# Patient Record
Sex: Male | Born: 1955 | Race: Asian | Hispanic: No | Marital: Married | Smoking: Never smoker
Health system: Southern US, Community
[De-identification: ages and names within clinical notes are randomized; demographics above are authoritative.]

## PROBLEM LIST (undated history)

## (undated) DIAGNOSIS — J45909 Unspecified asthma, uncomplicated: Secondary | ICD-10-CM

## (undated) DIAGNOSIS — I639 Cerebral infarction, unspecified: Secondary | ICD-10-CM

## (undated) DIAGNOSIS — I2510 Atherosclerotic heart disease of native coronary artery without angina pectoris: Secondary | ICD-10-CM

## (undated) DIAGNOSIS — N200 Calculus of kidney: Secondary | ICD-10-CM

## (undated) DIAGNOSIS — E119 Type 2 diabetes mellitus without complications: Secondary | ICD-10-CM

## (undated) HISTORY — DX: Unspecified asthma, uncomplicated: J45.909

## (undated) HISTORY — PX: OTHER SURGICAL HISTORY: SHX169

## (undated) HISTORY — DX: Calculus of kidney: N20.0

## (undated) HISTORY — PX: PROSTATE SURGERY: SHX751

---

## 2016-10-05 ENCOUNTER — Ambulatory Visit (HOSPITAL_COMMUNITY)
Admission: EM | Admit: 2016-10-05 | Discharge: 2016-10-05 | Disposition: A | Discharge status: Home / Self Care | Payer: Self-pay | Attending: Family Medicine | Admitting: Family Medicine

## 2016-10-05 ENCOUNTER — Encounter (HOSPITAL_COMMUNITY): Payer: Self-pay | Admitting: *Deleted

## 2016-10-05 DIAGNOSIS — J40 Bronchitis, not specified as acute or chronic: Secondary | ICD-10-CM

## 2016-10-05 DIAGNOSIS — R05 Cough: Secondary | ICD-10-CM

## 2016-10-05 DIAGNOSIS — R062 Wheezing: Secondary | ICD-10-CM

## 2016-10-05 HISTORY — DX: Type 2 diabetes mellitus without complications: E11.9

## 2016-10-05 HISTORY — DX: Atherosclerotic heart disease of native coronary artery without angina pectoris: I25.10

## 2016-10-05 HISTORY — DX: Cerebral infarction, unspecified: I63.9

## 2016-10-05 MED ORDER — ALBUTEROL SULFATE (2.5 MG/3ML) 0.083% IN NEBU
2.5000 mg | INHALATION_SOLUTION | Freq: Once | RESPIRATORY_TRACT | Status: AC
  Administered 2016-10-05: 2.5 mg via RESPIRATORY_TRACT

## 2016-10-05 MED ORDER — ALBUTEROL SULFATE (2.5 MG/3ML) 0.083% IN NEBU
INHALATION_SOLUTION | RESPIRATORY_TRACT | Status: AC
  Filled 2016-10-05: qty 3

## 2016-10-05 MED ORDER — HYDROCODONE-HOMATROPINE 5-1.5 MG/5ML PO SYRP: 5.0000 mL | ORAL_SOLUTION | Freq: Four times a day (QID) | ORAL | 0 refills | Status: DC | PRN

## 2016-10-05 MED ORDER — ALBUTEROL SULFATE HFA 108 (90 BASE) MCG/ACT IN AERS: 2.0000 | INHALATION_SPRAY | RESPIRATORY_TRACT | 1 refills | Status: DC | PRN

## 2016-10-05 MED ORDER — ALBUTEROL SULFATE HFA 108 (90 BASE) MCG/ACT IN AERS
1.0000 | INHALATION_SPRAY | Freq: Once | RESPIRATORY_TRACT | Status: AC
  Administered 2016-10-05: 2 via RESPIRATORY_TRACT

## 2016-10-05 MED ORDER — ALBUTEROL SULFATE HFA 108 (90 BASE) MCG/ACT IN AERS
INHALATION_SPRAY | RESPIRATORY_TRACT | Status: AC
  Filled 2016-10-05: qty 6.7

## 2016-10-05 MED FILL — HYDROCODONE-HOMATROPINE SYR: 5-1.5 | 6 days supply | Qty: 120 | Fill #0

## 2016-10-05 NOTE — ED Triage Notes (Addendum)
Patient reports intermittent productive cough x several days with no relief from OTC, denies fevers and nasal congestion/drainage. Patient just moved to [place] from [place].

## 2016-10-05 NOTE — ED Provider Notes (Signed)
[facility]    CSN: [id number] Arrival date & time: 10/05/16  1341     History   Chief Complaint Chief Complaint  Patient presents with  . Cough    HPI Jordan Bryant is a 61 y.o. male.   Patient reports intermittent productive cough x several days with no relief from OTC, denies fevers and nasal congestion/drainage.   He did have an upper respiratory infection several weeks ago but he seemed to get over this. He's never had asthma. He does have mild diabetes. His daughter is a Librarian, academic in [place]. 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 He does not have a history of asthma.      Past Medical History:  Diagnosis Date  . Coronary artery disease   . Diabetes mellitus without complication (HCC)   . Stroke [facility])     There are no active problems to display for this patient.   Past Surgical History:  Procedure Laterality Date  . PROSTATE SURGERY         Home Medications    Prior to Admission medications   Medication Sig Start Date End Date Taking? Authorizing Provider  aspirin 81 MG chewable tablet Chew by mouth daily.   Yes Historical Provider, MD  glipiZIDE (GLUCOTROL) 10 MG tablet Take 10 mg by mouth daily before breakfast.   Yes Historical Provider, MD  lovastatin (MEVACOR) 10 MG tablet Take 10 mg by mouth at bedtime.   Yes Historical Provider, MD  metFORMIN (GLUCOPHAGE) 500 MG tablet Take by mouth 2 (two) times daily with a meal.   Yes Historical Provider, MD  metoprolol tartrate (LOPRESSOR) 25 MG tablet Take 25 mg by mouth 2 (two) times daily.   Yes Historical Provider, MD  albuterol (PROVENTIL HFA;VENTOLIN HFA) 108 (90 Base) MCG/ACT inhaler Inhale 2 puffs into the lungs every 4 (four) hours as needed for wheezing or shortness of breath (cough, shortness of breath or wheezing.). 10/05/16   Elvina Sidle, MD  HYDROcodone-homatropine (HYDROMET) 5-1.5 MG/5ML syrup Take 5 mLs by mouth every 6 (six) hours as  needed for cough. 10/05/16   Elvina Sidle, MD    Family History History reviewed. No pertinent family history.  Social History Social History  Substance Use Topics  . Smoking status: Never Smoker  . Smokeless tobacco: Never Used  . Alcohol use Not on file     Allergies   Patient has no known allergies.   Review of Systems Review of Systems  Constitutional: Negative.   HENT: Negative.   Respiratory: Positive for cough, chest tightness and wheezing.   Genitourinary: Negative.   Neurological: Negative.   All other systems reviewed and are negative.    Physical Exam Triage Vital Signs ED Triage Vitals  Enc Vitals Group     BP      Pulse      Resp      Temp      Temp src      SpO2      Weight      Height      Head Circumference      Peak Flow      Pain Score      Pain Loc      Pain Edu?      Excl. in GC?    No data found.   Updated Vital Signs BP 120/79 (BP Location: Left Arm)   Pulse 89   Temp 99.2 F (37.3 C) (Oral)   Resp  18   SpO2 94%    Physical Exam  Constitutional: He is oriented to person, place, and time. He appears well-developed and well-nourished.  HENT:  Right Ear: External ear normal.  Left Ear: External ear normal.  Mouth/Throat: Oropharynx is clear and moist.  Eyes: Conjunctivae and EOM are normal. Pupils are equal, round, and reactive to light.  Neck: Normal range of motion. Neck supple.  Cardiovascular: Regular rhythm and normal heart sounds.   Pulmonary/Chest: Effort normal. He has wheezes.  Musculoskeletal: Normal range of motion.  Neurological: He is alert and oriented to person, place, and time.  Skin: Skin is warm and dry.  Nursing note and vitals reviewed.    UC Treatments / Results  Labs (all labs ordered are listed, but only abnormal results are displayed) Labs Reviewed - No data to display  EKG  EKG Interpretation None       Radiology No results found.  Procedures Procedures (including critical care  time)  Medications Ordered in UC Medications  albuterol (PROVENTIL) (2.5 MG/3ML) 0.083% nebulizer solution 2.5 mg (not administered)     Initial Impression / Assessment and Plan / UC Course  I have reviewed the triage vital signs and the nursing notes.  Pertinent labs & imaging results that were available during my care of the patient were reviewed by me and considered in my medical decision making (see chart for details).     Final Clinical Impressions(s) / UC Diagnoses   Final diagnoses:  Bronchitis    New Prescriptions New Prescriptions   ALBUTEROL (PROVENTIL HFA;VENTOLIN HFA) 108 (90 BASE) MCG/ACT INHALER    Inhale 2 puffs into the lungs every 4 (four) hours as needed for wheezing or shortness of breath (cough, shortness of breath or wheezing.).   HYDROCODONE-HOMATROPINE (HYDROMET) 5-1.5 MG/5ML SYRUP    Take 5 mLs by mouth every 6 (six) hours as needed for cough.     Elvina Sidle, MD 10/05/16 [phone]

## 2016-10-11 ENCOUNTER — Ambulatory Visit (INDEPENDENT_AMBULATORY_CARE_PROVIDER_SITE_OTHER): Payer: Self-pay | Admitting: Physician Assistant

## 2016-10-11 ENCOUNTER — Encounter (INDEPENDENT_AMBULATORY_CARE_PROVIDER_SITE_OTHER): Payer: Self-pay | Admitting: Physician Assistant

## 2016-10-11 ENCOUNTER — Ambulatory Visit (HOSPITAL_COMMUNITY)
Admission: RE | Admit: 2016-10-11 | Discharge: 2016-10-11 | Disposition: A | Discharge status: Home / Self Care | Payer: Self-pay | Source: Ambulatory Visit | Attending: Physician Assistant | Admitting: Physician Assistant

## 2016-10-11 VITALS — BP 110/69 | HR 76 | Temp 98.0°F | Resp 18 | Ht 68.0 in | Wt 161.0 lb

## 2016-10-11 DIAGNOSIS — E119 Type 2 diabetes mellitus without complications: Secondary | ICD-10-CM

## 2016-10-11 DIAGNOSIS — J9811 Atelectasis: Secondary | ICD-10-CM | POA: Insufficient documentation

## 2016-10-11 DIAGNOSIS — R05 Cough: Secondary | ICD-10-CM | POA: Insufficient documentation

## 2016-10-11 DIAGNOSIS — M25512 Pain in left shoulder: Secondary | ICD-10-CM | POA: Insufficient documentation

## 2016-10-11 DIAGNOSIS — R059 Cough, unspecified: Secondary | ICD-10-CM

## 2016-10-11 LAB — GLUCOSE, POCT (MANUAL RESULT ENTRY): POC Glucose: 194 mg/dl — AB (ref 70–99)

## 2016-10-11 LAB — POCT GLYCOSYLATED HEMOGLOBIN (HGB A1C): HEMOGLOBIN A1C: 7.8

## 2016-10-11 MED ORDER — AZITHROMYCIN 250 MG PO TABS: ORAL_TABLET | ORAL | 0 refills | Status: DC

## 2016-10-11 MED ORDER — ACETAMINOPHEN 500 MG PO TABS: 1000.0000 mg | ORAL_TABLET | Freq: Three times a day (TID) | ORAL | 0 refills | Status: DC | PRN

## 2016-10-11 MED ORDER — ACETAMINOPHEN 500 MG PO TABS: 1000.0000 mg | ORAL_TABLET | Freq: Three times a day (TID) | ORAL | 0 refills | Status: AC | PRN

## 2016-10-11 MED ORDER — NAPROXEN 500 MG PO TABS: 500.0000 mg | ORAL_TABLET | Freq: Two times a day (BID) | ORAL | 0 refills | Status: DC

## 2016-10-11 MED FILL — NAPROXEN 500 MG TABLET: 500 | 15 days supply | Qty: 30 | Fill #0

## 2016-10-11 MED FILL — AZITHROMYCIN 250 MG TABLET: 250 | 5 days supply | Qty: 6 | Fill #0

## 2016-10-11 NOTE — Patient Instructions (Signed)
Diabetes Mellitus and Exercise Exercising regularly is important for your overall health, especially when you have diabetes (diabetes mellitus). Exercising is not only about losing weight. It has many health benefits, such as increasing muscle strength and bone density and reducing body fat and stress. This leads to improved fitness, flexibility, and endurance, all of which result in better overall health. Exercise has additional benefits for people with diabetes, including:  Reducing appetite.  Helping to lower and control blood glucose.  Lowering blood pressure.  Helping to control amounts of fatty substances (lipids) in the blood, such as cholesterol and triglycerides.  Helping the body to respond better to insulin (improving insulin sensitivity).  Reducing how much insulin the body needs.  Decreasing the risk for heart disease by:  Lowering cholesterol and triglyceride levels.  Increasing the levels of good cholesterol.  Lowering blood glucose levels. What is my activity plan? Your health care provider or certified diabetes educator can help you make a plan for the type and frequency of exercise (activity plan) that works for you. Make sure that you:  Do at least 150 minutes of moderate-intensity or vigorous-intensity exercise each week. This could be brisk walking, biking, or water aerobics.  Do stretching and strength exercises, such as yoga or weightlifting, at least 2 times a week.  Spread out your activity over at least 3 days of the week.  Get some form of physical activity every day.  Do not go more than 2 days in a row without some kind of physical activity.  Avoid being inactive for more than 90 minutes at a time. Take frequent breaks to walk or stretch.  Choose a type of exercise or activity that you enjoy, and set realistic goals.  Start slowly, and gradually increase the intensity of your exercise over time. What do I need to know about managing my  diabetes?  Check your blood glucose before and after exercising.  If your blood glucose is higher than 240 mg/dL (13.3 mmol/L) before you exercise, check your urine for ketones. If you have ketones in your urine, do not exercise until your blood glucose returns to normal.  Know the symptoms of low blood glucose (hypoglycemia) and how to treat it. Your risk for hypoglycemia increases during and after exercise. Common symptoms of hypoglycemia can include:  Hunger.  Anxiety.  Sweating and feeling clammy.  Confusion.  Dizziness or feeling light-headed.  Increased heart rate or palpitations.  Blurry vision.  Tingling or numbness around the mouth, lips, or tongue.  Tremors or shakes.  Irritability.  Keep a rapid-acting carbohydrate snack available before, during, and after exercise to help prevent or treat hypoglycemia.  Avoid injecting insulin into areas of the body that are going to be exercised. For example, avoid injecting insulin into:  The arms, when playing tennis.  The legs, when jogging.  Keep records of your exercise habits. Doing this can help you and your health care provider adjust your diabetes management plan as needed. Write down:  Food that you eat before and after you exercise.  Blood glucose levels before and after you exercise.  The type and amount of exercise you have done.  When your insulin is expected to peak, if you use insulin. Avoid exercising at times when your insulin is peaking.  When you start a new exercise or activity, work with your health care provider to make sure the activity is safe for you, and to adjust your insulin, medicines, or food intake as needed.  Drink plenty   of water while you exercise to prevent dehydration or heat stroke. Drink enough fluid to keep your urine clear or pale yellow. This information is not intended to replace advice given to you by your health care provider. Make sure you discuss any questions you have with  your health care provider. Document Released: 08/20/2003 Document Revised: 12/18/2015 Document Reviewed: 11/09/2015 Elsevier Interactive Patient Education  2017 [employer]  

## 2016-10-11 NOTE — Progress Notes (Signed)
Subjective:  Patient ID: Jordan Bryant, male    DOB: 06-02-1956  Age: 61 y.o. MRN: [id number]  CC: cough, left shoulder pain  HPI Jordan Bryant is a 61 y.o. RHD male with a PMH of CAD, DM2, and Stroke presents to f/u on ED visit 10/05/16. Diagnosed with Bronchitis, prescribed Albuterol and Hydromet. Cough has not been eliminated but Hydromet has helped reduce the cough when taken. Reports excessive amount of coughing yesterday, less so today. Does not endorse f/c/n/v, SOB, CP, prolonged travel.     Pt also complains of a 3 wk hx of L shoulder pain. He exercises at the gym but can not attribute pain to anything in particular. Denies trauma or fall. States abduction and flexion to approximately 90 degrees elicits pain in the anterior aspect of shoulder. Denies weakness, paralysis, tingling, or numbness.     Outpatient Medications Prior to Visit  Medication Sig Dispense Refill  . albuterol (PROVENTIL HFA;VENTOLIN HFA) 108 (90 Base) MCG/ACT inhaler Inhale 2 puffs into the lungs every 4 (four) hours as needed for wheezing or shortness of breath (cough, shortness of breath or wheezing.). 1 Inhaler 1  . aspirin 81 MG chewable tablet Chew by mouth daily.    Marland Kitchen glipiZIDE (GLUCOTROL) 10 MG tablet Take 10 mg by mouth daily before breakfast.    . HYDROcodone-homatropine (HYDROMET) 5-1.5 MG/5ML syrup Take 5 mLs by mouth every 6 (six) hours as needed for cough. 120 mL 0  . lovastatin (MEVACOR) 10 MG tablet Take 10 mg by mouth at bedtime.    . metFORMIN (GLUCOPHAGE) 500 MG tablet Take by mouth 2 (two) times daily with a meal.    . metoprolol tartrate (LOPRESSOR) 25 MG tablet Take 25 mg by mouth 2 (two) times daily.     No facility-administered medications prior to visit.      ROS Review of Systems  Constitutional: Negative for chills, fever and malaise/fatigue.  Eyes: Negative for blurred vision.  Respiratory: Positive for cough. Negative for shortness of breath.   Cardiovascular: Negative for chest pain  and palpitations.  Gastrointestinal: Negative for abdominal pain and nausea.  Genitourinary: Negative for dysuria and hematuria.  Musculoskeletal: Positive for joint pain. Negative for myalgias.  Skin: Negative for rash.  Neurological: Negative for tingling and headaches.  Psychiatric/Behavioral: Negative for depression. The patient is not nervous/anxious.     Objective:  BP 110/69 (BP Location: Left Arm, Patient Position: Sitting, Cuff Size: Normal)   Pulse 76   Temp 98 F (36.7 C) (Oral)   Resp 18   Ht  (1.727 m)   Wt 161 lb (73 kg)   SpO2 94%   BMI 24.48 kg/m   BP/Weight 10/11/2016 10/05/2016  Systolic BP 110 120  Diastolic BP 69 79  Wt. (Lbs) 161 -  BMI 24.48 -      Physical Exam  Constitutional: He is oriented to person, place, and time.  Well developed, mild central obesity, NAD, polite  HENT:  Head: Normocephalic and atraumatic.  Eyes: No scleral icterus.  Neck: Normal range of motion. Neck supple. No thyromegaly present.  Cardiovascular: Normal rate, regular rhythm and normal heart sounds.   Pulmonary/Chest: Effort normal and breath sounds normal.  Musculoskeletal: He exhibits no edema.  Left shoulder with positive Apprehension test, External rotation resistance testing, cross body adduction, and painful arc. Negative O'Briens', AC shear testing, internal rotation resistance, and Napoleon's.   Neurological: He is alert and oriented to person, place, and time.  Skin: Skin is warm and dry.  No rash noted. No erythema. No pallor.  Psychiatric: He has a normal mood and affect. His behavior is normal. Thought content normal.  Vitals reviewed.    Assessment & Plan:   1. Cough - No CXR at ED. Cough persists, will start empirically on Zpack. - DG Chest 2 View; Future - Begin azithromycin (ZITHROMAX) 250 MG tablet; Two tablets together on day one. Then one tablet daily thereafter.  Dispense: 6 tablet; Refill: 0  2. Type 2 diabetes mellitus without complication,  without long-term current use of insulin (HCC) - HgB A1c 7.8% in clinic today. - Glucose (CBG) 194 - Pt declines increase in Metformin. Would like to diet and exercise first.   3. Acute pain of left shoulder - Suspected subscapularis injury - DG Shoulder Left; Future - Advised to stop left shoulder exercises. - Consider PT should pain continue. - Begin naproxen (NAPROSYN) 500 MG tablet; Take 1 tablet (500 mg total) by mouth 2 (two) times daily with a meal.  Dispense: 30 tablet; Refill: 0 - Begin acetaminophen (TYLENOL) 500 MG tablet; Take 2 tablets (1,000 mg total) by mouth every 8 (eight) hours as needed.  Dispense: 21 tablet; Refill: 0 - Declined Tylenol #3  Meds ordered this encounter  Medications  . naproxen (NAPROSYN) 500 MG tablet    Sig: Take 1 tablet (500 mg total) by mouth 2 (two) times daily with a meal.    Dispense:  30 tablet    Refill:  0    Order Specific Question:   Supervising Provider    Answer:   Quentin Angst [id number]  . acetaminophen (TYLENOL) 500 MG tablet    Sig: Take 2 tablets (1,000 mg total) by mouth every 8 (eight) hours as needed.    Dispense:  21 tablet    Refill:  0    Order Specific Question:   Supervising Provider    Answer:   Quentin Angst [id number]  . azithromycin (ZITHROMAX) 250 MG tablet    Sig: Two tablets together on day one. Then one tablet daily thereafter.    Dispense:  6 tablet    Refill:  0    Order Specific Question:   Supervising Provider    Answer:   Quentin Angst [id number]    Follow-up: Return in about 2 weeks (around 10/25/2016) for shoulder pain. Diabetes full exam..   Loletta Specter PA

## 2016-10-26 ENCOUNTER — Ambulatory Visit (INDEPENDENT_AMBULATORY_CARE_PROVIDER_SITE_OTHER): Payer: Self-pay | Admitting: Physician Assistant

## 2016-11-01 ENCOUNTER — Encounter (INDEPENDENT_AMBULATORY_CARE_PROVIDER_SITE_OTHER): Payer: Self-pay | Admitting: Physician Assistant

## 2016-11-01 ENCOUNTER — Other Ambulatory Visit: Payer: Self-pay | Admitting: Pharmacist

## 2016-11-01 ENCOUNTER — Ambulatory Visit (INDEPENDENT_AMBULATORY_CARE_PROVIDER_SITE_OTHER): Payer: Self-pay | Admitting: Physician Assistant

## 2016-11-01 VITALS — BP 122/79 | HR 69 | Temp 97.8°F | Wt 170.3 lb

## 2016-11-01 DIAGNOSIS — E785 Hyperlipidemia, unspecified: Secondary | ICD-10-CM

## 2016-11-01 DIAGNOSIS — E119 Type 2 diabetes mellitus without complications: Secondary | ICD-10-CM

## 2016-11-01 DIAGNOSIS — Z794 Long term (current) use of insulin: Secondary | ICD-10-CM

## 2016-11-01 DIAGNOSIS — S46002A Unspecified injury of muscle(s) and tendon(s) of the rotator cuff of left shoulder, initial encounter: Secondary | ICD-10-CM

## 2016-11-01 MED ORDER — LANCETS MISC: 1.0000 | Freq: Three times a day (TID) | 11 refills | Status: DC

## 2016-11-01 MED ORDER — DICLOFENAC SODIUM 75 MG PO TBEC: 75.0000 mg | DELAYED_RELEASE_TABLET | Freq: Two times a day (BID) | ORAL | 0 refills | Status: DC

## 2016-11-01 MED ORDER — TRUE METRIX AIR GLUCOSE METER W/DEVICE KIT: 1.0000 | PACK | 0 refills | Status: DC

## 2016-11-01 MED ORDER — GLUCOSE BLOOD VI STRP: ORAL_STRIP | 12 refills | Status: DC

## 2016-11-01 MED FILL — !TRUE METRIX BLOOD GLUCOSE: 30 days supply | Qty: 1 | Fill #0

## 2016-11-01 MED FILL — TRUEplus LANCETS 28G MISC: 30 days supply | Qty: 100 | Fill #0

## 2016-11-01 MED FILL — TRUE METRIX TEST STRIP: 30 days supply | Qty: 100 | Fill #0

## 2016-11-01 MED FILL — ?DICLOFENAC SOD DR 75 MG TA: 75 | 15 days supply | Qty: 30 | Fill #0

## 2016-11-01 NOTE — Patient Instructions (Addendum)
I believe you may have injury to the subscapularis. I have made an orthopedic referral for you. Please take your medications as directed. I will send out lovastatin according to your cholesterol level.   Rotator Cuff Injury Rotator cuff injury is any type of injury to the set of muscles and tendons that make up the stabilizing unit of your shoulder. This unit holds the ball of your upper arm bone (humerus) in the socket of your shoulder blade (scapula). What are the causes? Injuries to your rotator cuff most commonly come from sports or activities that cause your arm to be moved repeatedly over your head. Examples of this include throwing, weight lifting, swimming, or racquet sports. Long lasting (chronic) irritation of your rotator cuff can cause soreness and swelling (inflammation), bursitis, and eventual damage to your tendons, such as a tear (rupture). What are the signs or symptoms? Acute rotator cuff tear:  Sudden tearing sensation followed by severe pain shooting from your upper shoulder down your arm toward your elbow.  Decreased range of motion of your shoulder because of pain and muscle spasm.  Severe pain.  Inability to raise your arm out to the side because of pain and loss of muscle power (large tears). Chronic rotator cuff tear:  Pain that usually is worse at night and may interfere with sleep.  Gradual weakness and decreased shoulder motion as the pain worsens.  Decreased range of motion. Rotator cuff tendinitis:  Deep ache in your shoulder and the outside upper arm over your shoulder.  Pain that comes on gradually and becomes worse when lifting your arm to the side or turning it inward. How is this diagnosed? Rotator cuff injury is diagnosed through a medical history, physical exam, and imaging exam. The medical history helps determine the type of rotator cuff injury. Your health care provider will look at your injured shoulder, feel the injured area, and ask you to move  your shoulder in different positions. X-ray exams typically are done to rule out other causes of shoulder pain, such as fractures. MRI is the exam of choice for the most severe shoulder injuries because the images show muscles and tendons. How is this treated? Chronic tear:  Medicine for pain, such as acetaminophen or ibuprofen.  Physical therapy and range-of-motion exercises may be helpful in maintaining shoulder function and strength.  Steroid injections into your shoulder joint.  Surgical repair of the rotator cuff if the injury does not heal with noninvasive treatment. Acute tear:  Anti-inflammatory medicines such as ibuprofen and naproxen to help reduce pain and swelling.  A sling to help support your arm and rest your rotator cuff muscles. Long-term use of a sling is not advised. It may cause significant stiffening of the shoulder joint.  Surgery may be considered within a few weeks, especially in younger, active people, to return the shoulder to full function.  Indications for surgical treatment include the following:  Age younger than 60 years.  Rotator cuff tears that are complete.  Physical therapy, rest, and anti-inflammatory medicines have been used for 6-8 weeks, with no improvement.  Employment or sporting activity that requires constant shoulder use. Tendinitis:  Anti-inflammatory medicines such as ibuprofen and naproxen to help reduce pain and swelling.  A sling to help support your arm and rest your rotator cuff muscles. Long-term use of a sling is not advised. It may cause significant stiffening of the shoulder joint.  Severe tendinitis may require:  Steroid injections into your shoulder joint.  Physical therapy.  Surgery. Follow these instructions at home:  Apply ice to your injury:  Put ice in a plastic bag.  Place a towel between your skin and the bag.  Leave the ice on for 20 minutes, 2-3 times a day.  If you have a shoulder immobilizer (sling  and straps), wear it until told otherwise by your health care provider.  You may want to sleep on several pillows or in a recliner at night to lessen swelling and pain.  Only take over-the-counter or prescription medicines for pain, discomfort, or fever as directed by your health care provider.  Do simple hand squeezing exercises with a soft rubber ball to decrease hand swelling. Contact a health care provider if:  Your shoulder pain increases, or new pain or numbness develops in your arm, hand, or fingers.  Your hand or fingers are colder than your other hand. Get help right away if:  Your arm, hand, or fingers are numb or tingling.  Your arm, hand, or fingers are increasingly swollen and painful, or they turn white or blue. This information is not intended to replace advice given to you by your health care provider. Make sure you discuss any questions you have with your health care provider. Document Released: 05/27/2000 Document Revised: 11/05/2015 Document Reviewed: 01/09/2013 Elsevier Interactive Patient Education  2017 [employer].

## 2016-11-01 NOTE — Progress Notes (Signed)
Subjective:  Patient ID: Jordan Bryant, male    DOB: Jun 27, 1955  Age: 61 y.o. MRN: [id number]  CC: shoulder pain and refill requests.  HPI Jordan Bryant is a 61 y.o. male with a PMH of CAD, DM2, and stroke presents with left shoulder pain same as in the last visit. He is now at six weeks of shoulder pain. States abduction and flexion to approximately 90 degrees elicits pain in the anterior aspect of shoulder. Denies weakness, paralysis, tingling, or numbness.  NSAID and Tylenol does not seem to help. Refused narcotic medications for pain relief.     Requests refill of lancets and glucometer strips. Wants these for free and is requesting that his other medications be free of charge when sent to the pharmacy. Says all his medications were given to him free of charge when he lived in [place].      Outpatient Medications Prior to Visit  Medication Sig Dispense Refill  . albuterol (PROVENTIL HFA;VENTOLIN HFA) 108 (90 Base) MCG/ACT inhaler Inhale 2 puffs into the lungs every 4 (four) hours as needed for wheezing or shortness of breath (cough, shortness of breath or wheezing.). 1 Inhaler 1  . aspirin 81 MG chewable tablet Chew by mouth daily.    Marland Kitchen. azithromycin (ZITHROMAX) 250 MG tablet Two tablets together on day one. Then one tablet daily thereafter. 6 tablet 0  . glipiZIDE (GLUCOTROL) 10 MG tablet Take 10 mg by mouth daily before breakfast.    . HYDROcodone-homatropine (HYDROMET) 5-1.5 MG/5ML syrup Take 5 mLs by mouth every 6 (six) hours as needed for cough. 120 mL 0  . lovastatin (MEVACOR) 10 MG tablet Take 10 mg by mouth at bedtime.    . metFORMIN (GLUCOPHAGE) 500 MG tablet Take by mouth 2 (two) times daily with a meal.    . metoprolol tartrate (LOPRESSOR) 25 MG tablet Take 25 mg by mouth 2 (two) times daily.    . naproxen (NAPROSYN) 500 MG tablet Take 1 tablet (500 mg total) by mouth 2 (two) times daily with a meal. 30 tablet 0   No facility-administered medications prior to visit.       ROS Review of Systems  Constitutional: Negative for chills, fever and malaise/fatigue.  Eyes: Negative for blurred vision.  Respiratory: Negative for shortness of breath.   Cardiovascular: Negative for chest pain and palpitations.  Gastrointestinal: Negative for abdominal pain and nausea.  Genitourinary: Negative for dysuria and hematuria.  Musculoskeletal: Negative for joint pain and myalgias.  Skin: Negative for rash.  Neurological: Negative for tingling and headaches.  Psychiatric/Behavioral: Negative for depression. The patient is not nervous/anxious.     Objective:  BP 122/79 (BP Location: Left Arm, Patient Position: Sitting, Cuff Size: Normal)   Pulse 69   Temp 97.8 F (36.6 C) (Oral)   Wt 170 lb 4.2 oz (77.2 kg)   SpO2 96%   BMI 25.89 kg/m   BP/Weight 11/01/2016 10/11/2016 10/05/2016  Systolic BP 122 110 120  Diastolic BP 79 69 79  Wt. (Lbs) 170.26 161 -  BMI 25.89 24.48 -      Physical Exam  Constitutional: He is oriented to person, place, and time.  Well developed, mild central obesity, NAD, polite  HENT:  Head: Normocephalic and atraumatic.  Eyes: No scleral icterus.  Neck: Normal range of motion. Neck supple.  Cardiovascular: Normal rate, regular rhythm and normal heart sounds.   Pulmonary/Chest: Effort normal and breath sounds normal.  Abdominal: Soft. Bowel sounds are normal. There is no tenderness.  Musculoskeletal:  Left shoulder with positive Apprehension test, External rotation resistance, cross body adduction, and painful arc. Negative O'Briens', AC shear testing, internal rotation resistance, and Napoleon's.    Neurological: He is alert and oriented to person, place, and time. No cranial nerve deficit. Coordination normal.  Skin: Skin is warm and dry. No rash noted. No erythema. No pallor.  Psychiatric: He has a normal mood and affect. His behavior is normal. Thought content normal.  Vitals reviewed.    Assessment & Plan:   1. Injury of  left rotator cuff, initial encounter - AMB referral to orthopedics - Begin diclofenac (VOLTAREN) 75 MG EC tablet; Take 1 tablet (75 mg total) by mouth 2 (two) times daily.  Dispense: 30 tablet; Refill: 0 - I advised patient to go to [facility] to work with pharmacy to see if he can be placed on drug manufacturer assistance programs or if the pharmacy itself has financial assistance programs.  2. Hyperlipidemia, unspecified hyperlipidemia type - Lipid panel - Comprehensive metabolic panel - CBC with Differential  3. Type 2 diabetes mellitus without complication, with long-term current use of insulin (HCC) - Begin glucose blood test strip; Use as instructed  Dispense: 100 each; Refill: 12 - Begin Lancets MISC; 1 each by Does not apply route 3 (three) times daily.  Dispense: 90 each; Refill: 11 - Comprehensive metabolic panel - CBC with Differential   Meds ordered this encounter  Medications  . glucose blood test strip    Sig: Use as instructed    Dispense:  100 each    Refill:  12    Order Specific Question:   Supervising Provider    Answer:   Quentin Angst [[id number]]  . Lancets MISC    Sig: 1 each by Does not apply route 3 (three) times daily.    Dispense:  90 each    Refill:  11    Order Specific Question:   Supervising Provider    Answer:   Quentin Angst [id number]  . diclofenac (VOLTAREN) 75 MG EC tablet    Sig: Take 1 tablet (75 mg total) by mouth 2 (two) times daily.    Dispense:  30 tablet    Refill:  0    Order Specific Question:   Supervising Provider    Answer:   Quentin Angst [id number]    Follow-up: Return in about 9 weeks (around 01/03/2017) for shoulder and diabetes.Loletta Specter PA

## 2016-11-02 LAB — CBC WITH DIFFERENTIAL/PLATELET
Basophils Absolute: 0.1 10*3/uL (ref 0.0–0.2)
Basos: 2 %
EOS (ABSOLUTE): 0.4 10*3/uL (ref 0.0–0.4)
Eos: 6 %
Hematocrit: 43.6 % (ref 37.5–51.0)
Hemoglobin: 14.9 g/dL (ref 13.0–17.7)
IMMATURE GRANULOCYTES: 0 %
Immature Grans (Abs): 0 10*3/uL (ref 0.0–0.1)
Lymphocytes Absolute: 1.8 10*3/uL (ref 0.7–3.1)
Lymphs: 27 %
MCH: 27.3 pg (ref 26.6–33.0)
MCHC: 34.2 g/dL (ref 31.5–35.7)
MCV: 80 fL (ref 79–97)
MONOS ABS: 0.6 10*3/uL (ref 0.1–0.9)
Monocytes: 9 %
NEUTROS PCT: 56 %
Neutrophils Absolute: 3.8 10*3/uL (ref 1.4–7.0)
PLATELETS: 188 10*3/uL (ref 150–379)
RBC: 5.45 x10E6/uL (ref 4.14–5.80)
RDW: 14.5 % (ref 12.3–15.4)
WBC: 6.7 10*3/uL (ref 3.4–10.8)

## 2016-11-02 LAB — LIPID PANEL
CHOLESTEROL TOTAL: 141 mg/dL (ref 100–199)
Chol/HDL Ratio: 3.2 ratio (ref 0.0–5.0)
HDL: 44 mg/dL (ref >39)
LDL CALC: 61 mg/dL (ref 0–99)
Triglycerides: 182 mg/dL — ABNORMAL HIGH (ref 0–149)
VLDL CHOLESTEROL CAL: 36 mg/dL (ref 5–40)

## 2016-11-02 LAB — COMPREHENSIVE METABOLIC PANEL
A/G RATIO: 1.5 (ref 1.2–2.2)
ALK PHOS: 60 IU/L (ref 39–117)
ALT: 12 IU/L (ref 0–44)
AST: 18 IU/L (ref 0–40)
Albumin: 4.3 g/dL (ref 3.6–4.8)
BUN/Creatinine Ratio: 17 (ref 10–24)
BUN: 15 mg/dL (ref 8–27)
Bilirubin Total: 0.4 mg/dL (ref 0.0–1.2)
CHLORIDE: 102 mmol/L (ref 96–106)
CO2: 24 mmol/L (ref 18–29)
Calcium: 8.9 mg/dL (ref 8.6–10.2)
Creatinine, Ser: 0.9 mg/dL (ref 0.76–1.27)
GFR calc Af Amer: 105 mL/min/{1.73_m2} (ref >59)
GFR calc non Af Amer: 91 mL/min/{1.73_m2} (ref >59)
GLOBULIN, TOTAL: 2.8 g/dL (ref 1.5–4.5)
Glucose: 245 mg/dL — ABNORMAL HIGH (ref 65–99)
POTASSIUM: 4.7 mmol/L (ref 3.5–5.2)
SODIUM: 137 mmol/L (ref 134–144)
Total Protein: 7.1 g/dL (ref 6.0–8.5)

## 2016-11-03 ENCOUNTER — Telehealth (INDEPENDENT_AMBULATORY_CARE_PROVIDER_SITE_OTHER): Payer: Self-pay | Admitting: Physician Assistant

## 2016-11-03 ENCOUNTER — Other Ambulatory Visit (INDEPENDENT_AMBULATORY_CARE_PROVIDER_SITE_OTHER): Payer: Self-pay | Admitting: Physician Assistant

## 2016-11-03 ENCOUNTER — Ambulatory Visit (INDEPENDENT_AMBULATORY_CARE_PROVIDER_SITE_OTHER): Payer: Self-pay | Admitting: Orthopedic Surgery

## 2016-11-03 DIAGNOSIS — E785 Hyperlipidemia, unspecified: Secondary | ICD-10-CM

## 2016-11-03 MED ORDER — LOVASTATIN 10 MG PO TABS: 10.0000 mg | ORAL_TABLET | Freq: Every day | ORAL | 3 refills | Status: DC

## 2016-11-03 MED FILL — LOVASTATIN 10 MG TABLET: 10 | 30 days supply | Qty: 30 | Fill #0

## 2016-11-03 NOTE — Telephone Encounter (Signed)
Already addressed

## 2016-11-03 NOTE — Telephone Encounter (Signed)
FWD to PCP. Relda Agosto S Ruthmary Occhipinti, CMA  

## 2016-11-03 NOTE — Progress Notes (Signed)
Triglycerides elevated.

## 2016-11-03 NOTE — Telephone Encounter (Signed)
Patient here would like to lab result and wants to know which RX should he start for cholesterol.  Per patient does not have anymore medicine for cholesterol.  Please follow up with patient

## 2016-11-16 ENCOUNTER — Ambulatory Visit: Payer: Self-pay | Attending: Physician Assistant

## 2016-11-17 ENCOUNTER — Telehealth (INDEPENDENT_AMBULATORY_CARE_PROVIDER_SITE_OTHER): Payer: Self-pay | Admitting: Physician Assistant

## 2016-11-17 NOTE — Telephone Encounter (Signed)
FWD to PCP. Tempestt S Roberts, CMA  

## 2016-11-17 NOTE — Telephone Encounter (Signed)
Patient called stated that needs RX metformin 1000 mg tab. Stated he takes 1000 mg in morning and 1000mg  in afternoon.   Please follow up with patient.  [facility].

## 2016-11-17 NOTE — Telephone Encounter (Signed)
Pt's record show he was on metformin 500mg  BID so I refilled his medication at his first visit. Furthermore, he refused a higher dose of Metformin when I offered it to him, he stated he would rather diet and exercise. Please tell him to schedule an appointment and bring in his glucose log.

## 2016-11-18 NOTE — Telephone Encounter (Signed)
Patient called back at 3: 01 pm left voicemail to call him back, checking on status of Rx he request for Metformin.  Please follow up with patient.

## 2016-11-21 ENCOUNTER — Other Ambulatory Visit (INDEPENDENT_AMBULATORY_CARE_PROVIDER_SITE_OTHER): Payer: Self-pay | Admitting: Physician Assistant

## 2016-11-21 DIAGNOSIS — E119 Type 2 diabetes mellitus without complications: Secondary | ICD-10-CM

## 2016-11-21 MED ORDER — METFORMIN HCL 500 MG PO TABS: 500.0000 mg | ORAL_TABLET | Freq: Two times a day (BID) | ORAL | 3 refills | Status: DC

## 2016-11-21 MED FILL — ?METFORMIN HCL 500MG TABLET: 500 | 30 days supply | Qty: 60 | Fill #0

## 2016-11-21 NOTE — Telephone Encounter (Signed)
Patient called this morning left voicemail

## 2016-11-21 NOTE — Telephone Encounter (Signed)
Patient is requesting a refill on metformin which is a historical medication.

## 2016-11-21 NOTE — Telephone Encounter (Signed)
I have spoke with patient and scheduled an appointment for tomorrow, Patient is aware that he will need to bring his glucose log or his glucometer. Maryjean Mornempestt S Vuk Skillern, CMA

## 2016-11-21 NOTE — Telephone Encounter (Signed)
Let patient know his metformin has been refilled. He should have a f/u in the future and remember to bring glucose log.

## 2016-11-21 NOTE — Progress Notes (Signed)
Refill request

## 2016-11-21 NOTE — Telephone Encounter (Signed)
Patient called this am left voicemail stated has pills for today only.  Stated needs for someone too call him back with refills today. Stated called Friday also and no one returned his call.  Message for Friday was documented. Please follow up with patient.

## 2016-11-22 ENCOUNTER — Encounter (INDEPENDENT_AMBULATORY_CARE_PROVIDER_SITE_OTHER): Payer: Self-pay | Admitting: Physician Assistant

## 2016-11-22 ENCOUNTER — Ambulatory Visit (INDEPENDENT_AMBULATORY_CARE_PROVIDER_SITE_OTHER): Payer: Self-pay | Admitting: Physician Assistant

## 2016-11-22 VITALS — BP 122/82 | HR 89 | Temp 98.9°F | Wt 171.0 lb

## 2016-11-22 DIAGNOSIS — E119 Type 2 diabetes mellitus without complications: Secondary | ICD-10-CM

## 2016-11-22 DIAGNOSIS — Z794 Long term (current) use of insulin: Secondary | ICD-10-CM

## 2016-11-22 DIAGNOSIS — Z760 Encounter for issue of repeat prescription: Secondary | ICD-10-CM

## 2016-11-22 LAB — GLUCOSE, POCT (MANUAL RESULT ENTRY): POC GLUCOSE: 170 mg/dL — AB (ref 70–99)

## 2016-11-22 MED ORDER — GLIPIZIDE 5 MG PO TABS: 5.0000 mg | ORAL_TABLET | Freq: Two times a day (BID) | ORAL | 3 refills | Status: DC

## 2016-11-22 MED ORDER — METOPROLOL TARTRATE 25 MG PO TABS: 12.5000 mg | ORAL_TABLET | Freq: Two times a day (BID) | ORAL | 3 refills | Status: DC

## 2016-11-22 MED ORDER — METFORMIN HCL 1000 MG PO TABS: 1000.0000 mg | ORAL_TABLET | Freq: Two times a day (BID) | ORAL | 3 refills | Status: DC

## 2016-11-22 MED FILL — ?METOPROLOL 25 MG TABLET: 25 | 30 days supply | Qty: 30 | Fill #0

## 2016-11-22 MED FILL — ?GLIPIZIDE 5MG TABLET: 5 | 30 days supply | Qty: 60 | Fill #0

## 2016-11-22 NOTE — Progress Notes (Signed)
Subjective:  Patient ID: Jordan Bryant, male    DOB: 03/18/1956  Age: 61 y.o. MRN: [id number]  CC: requests increase in metformin  HPI Jordan Bryant is a 61 y.o. male with a PMH of DM2 presents to request medication refills. Would like metformin 1037m BID, glipizide 565mTID, and Metoprolol 2574malf tab BID. He is trying to control his blood sugar through diet and exercise but has been noticing slightly higher glucose readings since he is running out of metformin. Blood sugars sometimes reach 230s. No other symptoms or complaints.   *English is moderately good but there are times pt does not respond appropriately to questions or fully comprehend what is being said/asked. Rejects the need for interpreter.    Outpatient Medications Prior to Visit  Medication Sig Dispense Refill  . albuterol (PROVENTIL HFA;VENTOLIN HFA) 108 (90 Base) MCG/ACT inhaler Inhale 2 puffs into the lungs every 4 (four) hours as needed for wheezing or shortness of breath (cough, shortness of breath or wheezing.). 1 Inhaler 1  . aspirin 81 MG chewable tablet Chew by mouth daily.    . aMarland Kitchenithromycin (ZITHROMAX) 250 MG tablet Two tablets together on day one. Then one tablet daily thereafter. 6 tablet 0  . Blood Glucose Monitoring Suppl (TRUE METRIX AIR GLUCOSE METER) w/Device KIT 1 each by Does not apply route as directed. 1 kit 0  . diclofenac (VOLTAREN) 75 MG EC tablet Take 1 tablet (75 mg total) by mouth 2 (two) times daily. 30 tablet 0  . glucose blood test strip Use as instructed 100 each 12  . HYDROcodone-homatropine (HYDROMET) 5-1.5 MG/5ML syrup Take 5 mLs by mouth every 6 (six) hours as needed for cough. 120 mL 0  . Lancets MISC 1 each by Does not apply route 3 (three) times daily. 90 each 11  . lovastatin (MEVACOR) 10 MG tablet Take 1 tablet (10 mg total) by mouth at bedtime. 90 tablet 3  . naproxen (NAPROSYN) 500 MG tablet Take 1 tablet (500 mg total) by mouth 2 (two) times daily with a meal. 30 tablet 0  . glipiZIDE  (GLUCOTROL) 10 MG tablet Take 10 mg by mouth daily before breakfast.    . metFORMIN (GLUCOPHAGE) 500 MG tablet Take 1 tablet (500 mg total) by mouth 2 (two) times daily with a meal. 60 tablet 3  . metoprolol tartrate (LOPRESSOR) 25 MG tablet Take 25 mg by mouth 2 (two) times daily.     No facility-administered medications prior to visit.      ROS Review of Systems  Constitutional: Negative for chills, fever and malaise/fatigue.  Eyes: Negative for blurred vision.  Respiratory: Negative for shortness of breath.   Cardiovascular: Negative for chest pain and palpitations.  Gastrointestinal: Negative for abdominal pain and nausea.  Genitourinary: Negative for dysuria and hematuria.  Musculoskeletal: Negative for joint pain and myalgias.  Skin: Negative for rash.  Neurological: Negative for tingling and headaches.  Psychiatric/Behavioral: Negative for depression. The patient is not nervous/anxious.     Objective:  BP 122/82 (BP Location: Left Arm, Patient Position: Sitting, Cuff Size: Normal)   Pulse 89   Temp 98.9 F (37.2 C) (Oral)   Wt 171 lb (77.6 kg)   SpO2 95%   BMI 26.00 kg/m   BP/Weight 11/22/2016 10/31/00/585217/7/8242ystolic BP [id number] BP 82 79 69  Wt. (Lbs) 171 170.26 161  BMI 26 25.89 24.48      Physical Exam  Constitutional: He is oriented to person, place, and time.  Well developed, mild central obesity, NAD, polite, does not fully comprehend what is being spoken.  HENT:  Head: Normocephalic and atraumatic.  Eyes: No scleral icterus.  Neck: No thyromegaly present.  Cardiovascular: Normal rate, regular rhythm and normal heart sounds.   Pulmonary/Chest: Effort normal and breath sounds normal.  Neurological: He is alert and oriented to person, place, and time.  Psychiatric: He has a normal mood and affect. His behavior is normal. Thought content normal.  Vitals reviewed.    Assessment & Plan:   1. Type 2 diabetes mellitus without  complication, with long-term current use of insulin (HCC) - Glucose (CBG) 170 in clinic today - Begin metFORMIN (GLUCOPHAGE) 1000 MG tablet; Take 1 tablet (1,000 mg total) by mouth 2 (two) times daily with a meal.  Dispense: 180 tablet; Refill: 3 - Begin Glipizide 1m BID  - Advised patient to call with glucometer readings.  - Will consider GLP1 or SGLT2 if not well controlled. Pt seemed to take interest in these treatment modalities.  2. Medication refill - Refill metoprolol tartrate (LOPRESSOR) 25 MG tablet; Take 0.5 tablets (12.5 mg total) by mouth 2 (two) times daily.  Dispense: 180 tablet; Refill: 3   Meds ordered this encounter  Medications  . metFORMIN (GLUCOPHAGE) 1000 MG tablet    Sig: Take 1 tablet (1,000 mg total) by mouth 2 (two) times daily with a meal.    Dispense:  180 tablet    Refill:  3    Order Specific Question:   Supervising Provider    Answer:   JTresa Garter[[id number] . glipiZIDE (GLUCOTROL) 5 MG tablet    Sig: Take 1 tablet (5 mg total) by mouth 2 (two) times daily before a meal.    Dispense:  180 tablet    Refill:  3    Order Specific Question:   Supervising Provider    Answer:   JTresa Garter[[id number] . metoprolol tartrate (LOPRESSOR) 25 MG tablet    Sig: Take 0.5 tablets (12.5 mg total) by mouth 2 (two) times daily.    Dispense:  180 tablet    Refill:  3    Order Specific Question:   Supervising Provider    Answer:   JTresa Garter[[id number]   Follow-up: Return in about 3 months (around 02/22/2017).   RClent DemarkPA   ]

## 2016-11-22 NOTE — Patient Instructions (Signed)
Diabetes Mellitus and Food It is important for you to manage your blood sugar (glucose) level. Your blood glucose level can be greatly affected by what you eat. Eating healthier foods in the appropriate amounts throughout the day at about the same time each day will help you control your blood glucose level. It can also help slow or prevent worsening of your diabetes mellitus. Healthy eating may even help you improve the level of your blood pressure and reach or maintain a healthy weight. General recommendations for healthful eating and cooking habits include:  Eating meals and snacks regularly. Avoid going long periods of time without eating to lose weight.  Eating a diet that consists mainly of plant-based foods, such as fruits, vegetables, nuts, legumes, and whole grains.  Using low-heat cooking methods, such as baking, instead of high-heat cooking methods, such as deep frying.  Work with your dietitian to make sure you understand how to use the Nutrition Facts information on food labels. How can food affect me? Carbohydrates Carbohydrates affect your blood glucose level more than any other type of food. Your dietitian will help you determine how many carbohydrates to eat at each meal and teach you how to count carbohydrates. Counting carbohydrates is important to keep your blood glucose at a healthy level, especially if you are using insulin or taking certain medicines for diabetes mellitus. Alcohol Alcohol can cause sudden decreases in blood glucose (hypoglycemia), especially if you use insulin or take certain medicines for diabetes mellitus. Hypoglycemia can be a life-threatening condition. Symptoms of hypoglycemia (sleepiness, dizziness, and disorientation) are similar to symptoms of having too much alcohol. If your health care provider has given you approval to drink alcohol, do so in moderation and use the following guidelines:  Women should not have more than one drink per day, and men  should not have more than two drinks per day. One drink is equal to: ? 12 oz of beer. ? 5 oz of wine. ? 1 oz of hard liquor.  Do not drink on an empty stomach.  Keep yourself hydrated. Have water, diet soda, or unsweetened iced tea.  Regular soda, juice, and other mixers might contain a lot of carbohydrates and should be counted.  What foods are not recommended? As you make food choices, it is important to remember that all foods are not the same. Some foods have fewer nutrients per serving than other foods, even though they might have the same number of calories or carbohydrates. It is difficult to get your body what it needs when you eat foods with fewer nutrients. Examples of foods that you should avoid that are high in calories and carbohydrates but low in nutrients include:  Trans fats (most processed foods list trans fats on the Nutrition Facts label).  Regular soda.  Juice.  Candy.  Sweets, such as cake, pie, doughnuts, and cookies.  Fried foods.  What foods can I eat? Eat nutrient-rich foods, which will nourish your body and keep you healthy. The food you should eat also will depend on several factors, including:  The calories you need.  The medicines you take.  Your weight.  Your blood glucose level.  Your blood pressure level.  Your cholesterol level.  You should eat a variety of foods, including:  Protein. ? Lean cuts of meat. ? Proteins low in saturated fats, such as fish, egg whites, and beans. Avoid processed meats.  Fruits and vegetables. ? Fruits and vegetables that may help control blood glucose levels, such as apples,   mangoes, and yams.  Dairy products. ? Choose fat-free or low-fat dairy products, such as milk, yogurt, and cheese.  Grains, bread, pasta, and rice. ? Choose whole grain products, such as multigrain bread, whole oats, and brown rice. These foods may help control blood pressure.  Fats. ? Foods containing healthful fats, such as  nuts, avocado, olive oil, canola oil, and fish.  Does everyone with diabetes mellitus have the same meal plan? Because every person with diabetes mellitus is different, there is not one meal plan that works for everyone. It is very important that you meet with a dietitian who will help you create a meal plan that is just right for you. This information is not intended to replace advice given to you by your health care provider. Make sure you discuss any questions you have with your health care provider. Document Released: 02/24/2005 Document Revised: 11/05/2015 Document Reviewed: 04/26/2013 Elsevier Interactive Patient Education  2017 [employer]vier Inc.  

## 2016-11-24 MED FILL — ?METFORMIN HCL 1,000 MG TAB: 1000 | 30 days supply | Qty: 60 | Fill #0

## 2016-11-28 ENCOUNTER — Telehealth (INDEPENDENT_AMBULATORY_CARE_PROVIDER_SITE_OTHER): Payer: Self-pay | Admitting: Physician Assistant

## 2016-11-28 NOTE — Telephone Encounter (Signed)
FWD to PCP. Markell Sciascia S Guerin Lashomb, CMA  

## 2016-11-28 NOTE — Telephone Encounter (Signed)
Informed patient that [facility]. Maryjean Mornempestt S Tippi Mccrae, CMA

## 2016-11-28 NOTE — Telephone Encounter (Signed)
Patient left voicemail stated needs refills on Levastatin for cholesterol, only has 3 pills left.  Please follow up with patient.

## 2016-11-28 NOTE — Telephone Encounter (Signed)
Refill already sent out for Lovastatin 10mg  on 11/03/16 and sent to [facility].

## 2016-11-30 MED FILL — LOVASTATIN 10 MG TABLET: 10 | 30 days supply | Qty: 30 | Fill #1

## 2016-12-22 ENCOUNTER — Ambulatory Visit (INDEPENDENT_AMBULATORY_CARE_PROVIDER_SITE_OTHER): Payer: Self-pay | Admitting: Physician Assistant

## 2016-12-22 ENCOUNTER — Encounter (INDEPENDENT_AMBULATORY_CARE_PROVIDER_SITE_OTHER): Payer: Self-pay | Admitting: Physician Assistant

## 2016-12-22 VITALS — BP 119/79 | HR 67 | Temp 97.9°F | Wt 168.6 lb

## 2016-12-22 DIAGNOSIS — E119 Type 2 diabetes mellitus without complications: Secondary | ICD-10-CM

## 2016-12-22 DIAGNOSIS — M25512 Pain in left shoulder: Secondary | ICD-10-CM

## 2016-12-22 DIAGNOSIS — Z794 Long term (current) use of insulin: Secondary | ICD-10-CM

## 2016-12-22 DIAGNOSIS — G8929 Other chronic pain: Secondary | ICD-10-CM

## 2016-12-22 MED ORDER — EXENATIDE ER 2 MG ~~LOC~~ PEN: 1.0000 "application " | PEN_INJECTOR | SUBCUTANEOUS | 11 refills | Status: DC

## 2016-12-22 NOTE — Patient Instructions (Signed)
Exenatide injection suspension extended-release (autoinjector) What is this medicine? EXENATIDE (ex EN a tide) is used to improve blood sugar control in adults with type 2 diabetes. This medicine may be used with other oral diabetes medicines. This medicine may be used for other purposes; ask your health care provider or pharmacist if you have questions. COMMON BRAND NAME(S): Bydureon BCise What should I tell my health care provider before I take this medicine? They need to know if you have any of these conditions: -endocrine tumors (MEN 2) or if someone in your family had these tumors -history of pancreatitis -kidney disease or if you are on dialysis -stomach or intestine problems -thyroid cancer or if someone in your family had thyroid cancer -an unusual or allergic reaction to exenatide, medicines, foods, dyes, or preservatives -pregnant or trying to get pregnant -breast-feeding How should I use this medicine? This medicine is for injection under the skin of your upper leg, stomach area, or upper arm. It is usually given once every week (every 7 days). You will be taught how to prepare and give this medicine. Use exactly as directed. Take your medicine at regular intervals. Do not take it more often than directed. It is important that you put your used autoinjectors in a special sharps container. Do not put them in a trash can. If you do not have a sharps container, call your pharmacist or healthcare provider to get one. A special MedGuide will be given to you by the pharmacist with each prescription and refill. Be sure to read this information carefully each time. Talk to your pediatrician regarding the use of this medicine in children. Special care may be needed. Overdosage: If you think you have taken too much of this medicine contact a poison control center or emergency room at once. NOTE: This medicine is only for you. Do not share this medicine with others. What if I miss a dose? If  you miss a dose, take it as soon as you can, provided your next usual scheduled dose is due at least 3 days later. If you miss a dose and your next usual scheduled dose is due 1 or 2 days later, then do not take the missed dose. Take the next dose at your regular time. Do not take double or extra doses. If you have questions about a missed dose, contact your health care provider for advice. What may interact with this medicine? -acetaminophen -birth control pills -digoxin -insulin and other medicines for diabetes -lisinopril -lovastatin -warfarin Many medications may cause changes in blood sugar, these include: -alcohol containing beverages -antiviral medicines for HIV or AIDS -aspirin and aspirin-like drugs -certain medicines for blood pressure, heart disease, irregular heart beat -certain medicines for cholesterol like fenofibrate, gemfibrozil -chromium -diuretics -male hormones, such as estrogens or progestins, birth control pills -isoniazid -lanreotide -male hormones or anabolic steroids -MAOIs like Carbex, Eldepryl, Marplan, Nardil, and Parnate -medicines for allergies, asthma, cold, or cough -medicines for depression, anxiety, or psychotic disturbances -medicines for weight loss -niacin -nicotine -NSAIDs, medicines for pain and inflammation, like ibuprofen or naproxen -octreotide -pasireotide -pentamidine -phenytoin -probenecid -quinolone antibiotics such as ciprofloxacin, levofloxacin, ofloxacin -some herbal dietary supplements -steroid medicines such as prednisone or cortisone -sulfamethoxazole; trimethoprim -thyroid hormones Some medications can hide the warning symptoms of low blood sugar (hypoglycemia). You may need to monitor your blood sugar more closely if you are taking one of these medications. These include: -beta-blockers, often used for high blood pressure or heart problems (examples include atenolol, metoprolol,    propranolol) -clonidine -guanethidine -reserpine This list may not describe all possible interactions. Give your health care provider a list of all the medicines, herbs, non-prescription drugs, or dietary supplements you use. Also tell them if you smoke, drink alcohol, or use illegal drugs. Some items may interact with your medicine. What should I watch for while using this medicine? Visit your doctor or health care professional for regular checks on your progress. A test called the HbA1C (A1C) will be monitored. This is a simple blood test. It measures your blood sugar control over the last 2 to 3 months. You will receive this test every 3 to 6 months. Learn how to check your blood sugar. Learn the symptoms of low and high blood sugar and how to manage them. Always carry a quick-source of sugar with you in case you have symptoms of low blood sugar. Examples include hard sugar candy or glucose tablets. Make sure others know that you can choke if you eat or drink when you develop serious symptoms of low blood sugar, such as seizures or unconsciousness. They must get medical help at once. Tell your doctor or health care professional if you have high blood sugar. You might need to change the dose of your medicine. If you are sick or exercising more than usual, you might need to change the dose of your medicine. Do not skip meals. Ask your doctor or health care professional if you should avoid alcohol. Many nonprescription cough and cold products contain sugar or alcohol. These can affect blood sugar. Autoinjectors should never be shared. Sharing may result in passing of viruses like hepatitis or HIV. Wear a medical ID bracelet or chain, and carry a card that describes your disease and details of your medicine and dosage times. What side effects may I notice from receiving this medicine? Side effects that you should report to your doctor or health care professional as soon as possible: -allergic  reactions like skin rash, itching or hives, swelling of the face, lips, or tongue -breathing problems -diarrhea that continues or is severe -lump or swelling on the neck -severe nausea -signs and symptoms of low blood sugar such as feeling anxious, confusion, dizziness, increased hunger, unusually weak or tired, sweating, shakiness, cold, irritable, headache, blurred vision, fast heartbeat, loss of consciousness -signs and symptoms of kidney injury like trouble passing urine or change in the amount of urine -trouble swallowing -unusual stomach upset or pain -vomiting Side effects that usually do not require medical attention (report these to your doctor or health care professional if they continue or are bothersome): -constipation -diarrhea -dizziness -headache -nausea -pain, redness, or irritation at site where injected -stomach upset This list may not describe all possible side effects. Call your doctor for medical advice about side effects. You may report side effects to FDA at 1-800-FDA-1088. Where should I keep my medicine? Keep out of the reach of children. Store this medicine flat in a refrigerator between 2 and 8 degrees C (36 and 46 degrees F). Do not freeze. Do not use if the medicine has been frozen. Protect from light and excessive heat. Remove from the refrigerator 15 minutes before use. The autoinjector can be kept at room temperature not to exceed 30 degrees C (86 degrees F) for no more than a total of 4 weeks, if needed. Throw away any unused medicine after the expiration date on the label. NOTE: This sheet is a summary. It may not cover all possible information. If you have questions about this medicine,   talk to your doctor, pharmacist, or health care provider.  2018 Elsevier/Gold Standard (2016-06-17 13:46:10)  

## 2016-12-22 NOTE — Progress Notes (Signed)
  Subjective:  Patient ID: Jordan Bryant, male    DOB: 1955-08-02  Age: 61 y.o. MRN: [id number]  CC: left shoulder  HPI Jordan NewerSaghir Rorke is a 61 y.o. male with a PMH of CAD, DM2, and stroke presents with continued pain and limited aROM of the left shoulder. Initially went to "sports therapist" and received massages with no amelioration of shoulder pain. He was referred to orthopedics previously but he could not afford visit and did not go. He has since obtained [employer] [facility] and would like to be referred to physical therapy.     He also states that his blood sugar readings are now in the 200s since he was removed from Glipizide 5 mg TID. He is now taking Glipizide 5mg  BID and Metformin 1000 mg BID. Does not endorse any other symptoms or complaints.     ROS Review of Systems  Constitutional: Negative for chills, fever and malaise/fatigue.  Eyes: Negative for blurred vision.  Respiratory: Negative for shortness of breath.   Cardiovascular: Negative for chest pain and palpitations.  Gastrointestinal: Negative for abdominal pain and nausea.  Genitourinary: Negative for dysuria and hematuria.  Musculoskeletal: Positive for joint pain. Negative for myalgias.  Skin: Negative for rash.  Neurological: Negative for tingling and headaches.  Psychiatric/Behavioral: Negative for depression. The patient is not nervous/anxious.     Objective:  BP 119/79 (BP Location: Left Arm, Patient Position: Sitting, Cuff Size: Normal)   Pulse 67   Temp 97.9 F (36.6 C) (Oral)   Wt 168 lb 9.6 oz (76.5 kg)   SpO2 96%   BMI 25.64 kg/m   BP/Weight 12/22/2016 11/22/2016 11/01/2016  Systolic BP 119 122 122  Diastolic BP 79 82 79  Wt. (Lbs) 168.6 171 170.26  BMI 25.64 26 25.89      Physical Exam  Constitutional: He is oriented to person, place, and time.  Well developed, overweight, NAD  HENT:  Head: Normocephalic and atraumatic.  Eyes: No scleral icterus.  Neck: Normal range of motion.  Neck supple.  Musculoskeletal:  Left shoulder with limited aROM and pROM at approximate 110 degrees of abduction  Neurological: He is alert and oriented to person, place, and time. No cranial nerve deficit. Coordination normal.  LUE strength 5/5  Psychiatric: He has a normal mood and affect. His behavior is normal. Thought content normal.  Vitals reviewed.    Assessment & Plan:   1. Type 2 diabetes mellitus without complication, with long-term current use of insulin (HCC) - Exenatide ER 2 MG PEN; Inject 1 application into the skin once a week.  Dispense: 4 each; Refill: 11  2. Chronic left shoulder pain - AMB referral to orthopedics - Suspected impingement vs rotator cuff injury   Meds ordered this encounter  Medications  . Exenatide ER 2 MG PEN    Sig: Inject 1 application into the skin once a week.    Dispense:  4 each    Refill:  11    Please dispense GLP1 that is discounted or free.    Order Specific Question:   Supervising Provider    Answer:   Quentin AngstJEGEDE, OLUGBEMIGA E [[id number]]    Follow-up: No Follow-up on file.   Loletta Specteroger David Rameen Quinney PA

## 2016-12-23 ENCOUNTER — Other Ambulatory Visit (INDEPENDENT_AMBULATORY_CARE_PROVIDER_SITE_OTHER): Payer: Self-pay | Admitting: Physician Assistant

## 2016-12-23 ENCOUNTER — Telehealth (INDEPENDENT_AMBULATORY_CARE_PROVIDER_SITE_OTHER): Payer: Self-pay | Admitting: Physician Assistant

## 2016-12-23 DIAGNOSIS — Z794 Long term (current) use of insulin: Principal | ICD-10-CM

## 2016-12-23 DIAGNOSIS — E119 Type 2 diabetes mellitus without complications: Secondary | ICD-10-CM

## 2016-12-23 MED ORDER — EXENATIDE ER 2 MG ~~LOC~~ PEN: 1.0000 "application " | PEN_INJECTOR | SUBCUTANEOUS | 11 refills | Status: DC

## 2016-12-23 MED FILL — BYDUREON 2 MG PEN INJECT: 2 | 28 days supply | Qty: 4 | Fill #0

## 2016-12-23 NOTE — Telephone Encounter (Signed)
Patient called left voice needs medication [facility]. Stated [facility] was prescribed yesterday [facility] But [facility].  Please follow up with patient.

## 2016-12-23 NOTE — Telephone Encounter (Signed)
FWD to PCP. Tempestt S Roberts, CMA  

## 2016-12-23 NOTE — Telephone Encounter (Signed)
New prescription for exenatide has been sent to [facility].

## 2017-01-04 ENCOUNTER — Ambulatory Visit (INDEPENDENT_AMBULATORY_CARE_PROVIDER_SITE_OTHER): Payer: Self-pay | Admitting: Orthopedic Surgery

## 2017-01-04 ENCOUNTER — Encounter (INDEPENDENT_AMBULATORY_CARE_PROVIDER_SITE_OTHER): Payer: Self-pay | Admitting: Orthopedic Surgery

## 2017-01-04 DIAGNOSIS — M7502 Adhesive capsulitis of left shoulder: Secondary | ICD-10-CM

## 2017-01-06 NOTE — Progress Notes (Signed)
Office Visit Note   Patient: Jordan Bryant           Date of Birth: 10-29-1955           MRN: [id number] Visit Date: 01/04/2017 Requested by: Loletta SpecterGomez, Roger David, PA-C [address] [place], [place] [id number] PCP: Denny LevyGomez, Roger David, PA-C  Subjective: Chief Complaint  Patient presents with  . Left Shoulder - Pain    HPI: Patient is a 61 year old with left shoulder pain for several months.  Denies a history of injury.  Describes pain only with certain motions.  Does not wake him up from sleep at night.  He does report limited range of motion.  No numbness and tingling and neck pain.  Localizes the pain to the shoulder region deltoid and anterior.  He is right-hand-dominant.  Tried ibuprofen and naproxen without relief.              ROS: All systems reviewed are negative as they relate to the chief complaint within the history of present illness.  Patient denies  fevers or chills.   Assessment & Plan: Visit Diagnoses: No diagnosis found.  Plan: Impression is left shoulder pain early frozen shoulder.  We discussed several options which would include injection and formal physical therapy manipulation under anesthesia.  Patient really wants to try physical therapy first.  I don't think he has any type of rotator cuff tearing or rotator cuff pathology.  If he's not improved with physical therapy then he should come back for an intra-articular injection.  Patient understands and will schedule follow up.  I did give him a prescription for physical therapy and I'll see him back as needed  Follow-Up Instructions: Return if symptoms worsen or fail to improve.   Orders:  No orders of the defined types were placed in this encounter.  No orders of the defined types were placed in this encounter.     Procedures: No procedures performed   Clinical Data: No additional findings.  Objective: Vital Signs: There were no vitals taken for this visit.  Physical Exam:   Constitutional:  Patient appears well-developed HEENT:  Head: Normocephalic Eyes:EOM are normal Neck: Normal range of motion Cardiovascular: Normal rate Pulmonary/chest: Effort normal Neurologic: Patient is alert Skin: Skin is warm Psychiatric: Patient has normal mood and affect    Ortho Exam: Orthopedic exam demonstrates good cervical spine range of motion 5 out of 5 grip EPL FPL interosseous wrist flexion and wrist extension biceps triceps and deltoid strength.  Does have good rotator cuff strength isolated infraspinatus supraspinatus and subscapularis muscle testing on the left-hand side.  No masses lymph adenopathy or skin changes noted in the left shoulder region.  No discrete acromioclavicular joint tenderness is noted.  He does have restricted forward flexion on the left to 150 on the right 180.  Isolated glenohumeral abduction on the left 85 on the right 110.  External rotation at 15 of abduction on the left and 40 on the right 60.  Specialty Comments:  No specialty comments available.  Imaging: No results found.   PMFS History: Patient Active Problem List   Diagnosis Date Noted  . Type 2 diabetes mellitus without complication, with long-term current use of insulin (HCC) 11/22/2016   Past Medical History:  Diagnosis Date  . Coronary artery disease   . Diabetes mellitus without complication (HCC)   . Stroke [facility](HCC)     No family history on file.  Past Surgical History:  Procedure Laterality Date  . PROSTATE  SURGERY     Social History   Occupational History  . Not on file.   Social History Main Topics  . Smoking status: Never Smoker  . Smokeless tobacco: Never Used  . Alcohol use No  . Drug use: No  . Sexual activity: Yes

## 2017-01-09 ENCOUNTER — Telehealth (INDEPENDENT_AMBULATORY_CARE_PROVIDER_SITE_OTHER): Payer: Self-pay | Admitting: Physician Assistant

## 2017-01-09 MED FILL — metFORMIN HCL 1000 MG TABS: 1000 | 30 days supply | Qty: 60 | Fill #1

## 2017-01-09 MED FILL — LOVASTATIN 10 MG TABLET: 10 | 30 days supply | Qty: 30 | Fill #2

## 2017-01-09 NOTE — Telephone Encounter (Signed)
FWD to PCP. Tempestt S Roberts, CMA  

## 2017-01-09 NOTE — Telephone Encounter (Signed)
Per [facility], and was referred to [facility] but they do not accept [facility] or [facility] financial assistance.  Patient is request for a new referral to [facility].  Please follow up

## 2017-01-10 ENCOUNTER — Other Ambulatory Visit (INDEPENDENT_AMBULATORY_CARE_PROVIDER_SITE_OTHER): Payer: Self-pay | Admitting: Physician Assistant

## 2017-01-10 DIAGNOSIS — M25512 Pain in left shoulder: Principal | ICD-10-CM

## 2017-01-10 DIAGNOSIS — G8929 Other chronic pain: Secondary | ICD-10-CM

## 2017-01-10 MED FILL — TRUEplus LANCETS 28G MISC: 30 days supply | Qty: 100 | Fill #1

## 2017-01-10 MED FILL — TRUE METRIX TEST STRIP: 30 days supply | Qty: 100 | Fill #1

## 2017-01-10 NOTE — Telephone Encounter (Signed)
Patient notified that referral has been placed and someone will call him. Maryjean Mornempestt S Anique Beckley, CMA

## 2017-01-10 NOTE — Telephone Encounter (Signed)
Please call pt and let him know [facility]

## 2017-01-25 ENCOUNTER — Ambulatory Visit: Payer: Self-pay | Attending: Physician Assistant | Admitting: Rehabilitation

## 2017-01-30 ENCOUNTER — Ambulatory Visit (INDEPENDENT_AMBULATORY_CARE_PROVIDER_SITE_OTHER): Payer: Self-pay | Admitting: Physician Assistant

## 2017-01-31 ENCOUNTER — Ambulatory Visit (INDEPENDENT_AMBULATORY_CARE_PROVIDER_SITE_OTHER): Payer: Self-pay | Admitting: Physician Assistant

## 2017-02-01 ENCOUNTER — Ambulatory Visit: Payer: Self-pay | Admitting: Physical Therapy

## 2017-02-01 ENCOUNTER — Ambulatory Visit (INDEPENDENT_AMBULATORY_CARE_PROVIDER_SITE_OTHER): Payer: Self-pay | Admitting: Physician Assistant

## 2017-02-01 ENCOUNTER — Encounter (INDEPENDENT_AMBULATORY_CARE_PROVIDER_SITE_OTHER): Payer: Self-pay | Admitting: Physician Assistant

## 2017-02-01 VITALS — BP 122/81 | HR 78 | Temp 98.1°F | Wt 171.8 lb

## 2017-02-01 DIAGNOSIS — K5900 Constipation, unspecified: Secondary | ICD-10-CM

## 2017-02-01 DIAGNOSIS — E119 Type 2 diabetes mellitus without complications: Secondary | ICD-10-CM

## 2017-02-01 DIAGNOSIS — Z794 Long term (current) use of insulin: Secondary | ICD-10-CM

## 2017-02-01 DIAGNOSIS — Z23 Encounter for immunization: Secondary | ICD-10-CM

## 2017-02-01 DIAGNOSIS — Z1211 Encounter for screening for malignant neoplasm of colon: Secondary | ICD-10-CM

## 2017-02-01 DIAGNOSIS — E785 Hyperlipidemia, unspecified: Secondary | ICD-10-CM

## 2017-02-01 DIAGNOSIS — L309 Dermatitis, unspecified: Secondary | ICD-10-CM

## 2017-02-01 DIAGNOSIS — Z760 Encounter for issue of repeat prescription: Secondary | ICD-10-CM

## 2017-02-01 DIAGNOSIS — Z1159 Encounter for screening for other viral diseases: Secondary | ICD-10-CM

## 2017-02-01 DIAGNOSIS — Z114 Encounter for screening for human immunodeficiency virus [HIV]: Secondary | ICD-10-CM

## 2017-02-01 LAB — POCT GLYCOSYLATED HEMOGLOBIN (HGB A1C): Hemoglobin A1C: 6.9

## 2017-02-01 MED ORDER — LINACLOTIDE 145 MCG PO CAPS: 145.0000 ug | ORAL_CAPSULE | Freq: Every day | ORAL | 5 refills | Status: DC

## 2017-02-01 MED ORDER — LINACLOTIDE 72 MCG PO CAPS: 72.0000 ug | ORAL_CAPSULE | Freq: Every day | ORAL | 0 refills | Status: DC

## 2017-02-01 MED ORDER — LOVASTATIN 10 MG PO TABS: 10.0000 mg | ORAL_TABLET | Freq: Every day | ORAL | 3 refills | Status: DC

## 2017-02-01 MED ORDER — METOPROLOL TARTRATE 25 MG PO TABS: 12.5000 mg | ORAL_TABLET | Freq: Two times a day (BID) | ORAL | 3 refills | Status: DC

## 2017-02-01 MED ORDER — EXENATIDE ER 2 MG ~~LOC~~ PEN: 1.0000 "application " | PEN_INJECTOR | SUBCUTANEOUS | 11 refills | Status: DC

## 2017-02-01 MED ORDER — GLIPIZIDE 5 MG PO TABS: 5.0000 mg | ORAL_TABLET | Freq: Two times a day (BID) | ORAL | 3 refills | Status: DC

## 2017-02-01 MED ORDER — FLUOCINONIDE 0.05 % EX OINT: 1.0000 "application " | TOPICAL_OINTMENT | Freq: Two times a day (BID) | CUTANEOUS | 0 refills | Status: DC

## 2017-02-01 MED ORDER — METFORMIN HCL 1000 MG PO TABS: 1000.0000 mg | ORAL_TABLET | Freq: Two times a day (BID) | ORAL | 3 refills | Status: DC

## 2017-02-01 MED FILL — LOVASTATIN 10 MG TABLET: 10 | 30 days supply | Qty: 30 | Fill #0

## 2017-02-01 MED FILL — glipiZIDE 5 MG TABS: 5 | 30 days supply | Qty: 60 | Fill #0

## 2017-02-01 MED FILL — metFORMIN HCL 1000 MG TABS: 1000 | 30 days supply | Qty: 60 | Fill #0

## 2017-02-01 MED FILL — !LINZESS 72 MCG CAPSULE: 72 MCG | 30 days supply | Qty: 30 | Fill #0

## 2017-02-01 MED FILL — BYDUREON 2 MG PEN INJECT: 2 | 30 days supply | Qty: 4 | Fill #0

## 2017-02-01 MED FILL — METOPROLOL TARTRATE 25 MG T: 25 | 30 days supply | Qty: 30 | Fill #0

## 2017-02-01 MED FILL — FLUOCINONIDE 0.05% OINTMENT: 0.05 | 15 days supply | Qty: 30 | Fill #0

## 2017-02-01 NOTE — Progress Notes (Signed)
Subjective:  Patient ID: Jordan Bryant, male    DOB: 10-Mar-1956  Age: 61 y.o. MRN: [id number]  CC: DM, discuss medication  HPI Jordan Bryant is a 61 y.o. male with a medical history of CAD, DM2, and stroke. Says his blood sugar remains elevated above 200s. Says he saw a video from an [place] doctor that suggested a vegetable smoothie diet to eliminate the need for anti-glycemics. Has tried the smoothie diet and noticed his blood sugars drop to the low 100s. He has also needed to forego the glipizide evening dose due to lower glucometer readings. A1c at previous visit 7.8% and today's A1c is 6.9%.     Patient generally feeling well except for chronic constipation. He takes daily laxative, stool softener, and fiber in order to have regular bowel movements. Does not endorse melena, BRBPR, fecal incontinence, or constitutional symptoms.    Asks for refill on steroid ointment. Has occasional pruritic rash on the left lower extremity that is resolved with the use of fluocinonide ointment.     Requests screening tests be conducted as he is trying to be proactive about his health.  Outpatient Medications Prior to Visit  Medication Sig Dispense Refill  . albuterol (PROVENTIL HFA;VENTOLIN HFA) 108 (90 Base) MCG/ACT inhaler Inhale 2 puffs into the lungs every 4 (four) hours as needed for wheezing or shortness of breath (cough, shortness of breath or wheezing.). 1 Inhaler 1  . aspirin 81 MG chewable tablet Chew by mouth daily.    Marland Kitchen azithromycin (ZITHROMAX) 250 MG tablet Two tablets together on day one. Then one tablet daily thereafter. 6 tablet 0  . Blood Glucose Monitoring Suppl (TRUE METRIX AIR GLUCOSE METER) w/Device KIT 1 each by Does not apply route as directed. 1 kit 0  . diclofenac (VOLTAREN) 75 MG EC tablet Take 1 tablet (75 mg total) by mouth 2 (two) times daily. 30 tablet 0  . Exenatide ER 2 MG PEN Inject 1 application into the skin once a week. 4 each 11  . glipiZIDE (GLUCOTROL) 5 MG tablet Take 1  tablet (5 mg total) by mouth 2 (two) times daily before a meal. 180 tablet 3  . glucose blood test strip Use as instructed 100 each 12  . HYDROcodone-homatropine (HYDROMET) 5-1.5 MG/5ML syrup Take 5 mLs by mouth every 6 (six) hours as needed for cough. 120 mL 0  . Lancets MISC 1 each by Does not apply route 3 (three) times daily. 90 each 11  . lovastatin (MEVACOR) 10 MG tablet Take 1 tablet (10 mg total) by mouth at bedtime. 90 tablet 3  . metFORMIN (GLUCOPHAGE) 1000 MG tablet Take 1 tablet (1,000 mg total) by mouth 2 (two) times daily with a meal. 180 tablet 3  . metoprolol tartrate (LOPRESSOR) 25 MG tablet Take 0.5 tablets (12.5 mg total) by mouth 2 (two) times daily. 180 tablet 3  . naproxen (NAPROSYN) 500 MG tablet Take 1 tablet (500 mg total) by mouth 2 (two) times daily with a meal. 30 tablet 0   No facility-administered medications prior to visit.      ROS Review of Systems  Constitutional: Negative for chills, fever and malaise/fatigue.  Eyes: Negative for blurred vision.  Respiratory: Negative for shortness of breath.   Cardiovascular: Negative for chest pain and palpitations.  Gastrointestinal: Negative for abdominal pain and nausea.  Genitourinary: Negative for dysuria and hematuria.  Musculoskeletal: Negative for joint pain and myalgias.  Skin: Negative for rash.  Neurological: Negative for tingling and headaches.  Psychiatric/Behavioral:  Negative for depression. The patient is not nervous/anxious.     Objective:  BP 122/81 (BP Location: Left Arm, Patient Position: Sitting, Cuff Size: Normal)   Pulse 78   Temp 98.1 F (36.7 C) (Oral)   Wt 171 lb 12.8 oz (77.9 kg)   SpO2 95%   BMI 26.12 kg/m   BP/Weight 02/01/2017 12/22/2016 0/25/8527  Systolic BP 782 423 536  Diastolic BP 81 79 82  Wt. (Lbs) 171.8 168.6 171  BMI 26.12 25.64 26      Physical Exam  Constitutional: He is oriented to person, place, and time.  Well developed, well nourished, NAD, polite  HENT:   Head: Normocephalic and atraumatic.  Eyes: Conjunctivae are normal. No scleral icterus.  Neck: Normal range of motion. Neck supple. No thyromegaly present.  Cardiovascular: Normal rate, regular rhythm and normal heart sounds.   Pulmonary/Chest: Effort normal and breath sounds normal.  Musculoskeletal: He exhibits no edema.  Neurological: He is alert and oriented to person, place, and time. No cranial nerve deficit. Coordination normal.  No loss of protective sensation bilaterally with monofilament testing.  Skin: Skin is warm and dry. No rash noted. No erythema. No pallor.  Dry skin of the Lower extremities.  Psychiatric: He has a normal mood and affect. His behavior is normal. Thought content normal.  Vitals reviewed.    Assessment & Plan:    1. Type 2 diabetes mellitus without complication, with long-term current use of insulin (HCC) - HgB A1c.6.9% improved over 7.8% three months ago. - TSH - Ambulatory referral to Ophthalmology - Comprehensive metabolic panel - Microalbumin/Creatinine Ratio, Urine - Refill Exenatide ER 2 MG PEN; Inject 1 application into the skin once a week.  Dispense: 4 each; Refill: 11 - Refill glipiZIDE (GLUCOTROL) 5 MG tablet; Take 1 tablet (5 mg total) by mouth 2 (two) times daily before a meal.  Dispense: 180 tablet; Refill: 3 - Refill metFORMIN (GLUCOPHAGE) 1000 MG tablet; Take 1 tablet (1,000 mg total) by mouth 2 (two) times daily with a meal.  Dispense: 180 tablet; Refill: 3  2. Eczema, unspecified type - Begin fluocinonide ointment (LIDEX) 0.05 %; Apply 1 application topically 2 (two) times daily.  Dispense: 30 g; Refill: 0  3. Medication refill - metoprolol tartrate (LOPRESSOR) 25 MG tablet; Take 0.5 tablets (12.5 mg total) by mouth 2 (two) times daily.  Dispense: 180 tablet; Refill: 3  4. Encounter for screening for HIV - HIV antibody  5. Need for hepatitis C screening test - Hep c Ab  6. Need for Tdap vaccination - Tdap vaccine greater than  or equal to 7yo IM  7. Need for pneumococcal vaccination - Pneumococcal conjugate vaccine 13-valent  8. Special screening for malignant neoplasms, colon - Ambulatory referral to Gastroenterology  9. Constipation, unspecified constipation type - Begin linaclotide (LINZESS) 145 MCG CAPS capsule; Take 1 capsule (145 mcg total) by mouth daily before breakfast.  Dispense: 30 capsule; Refill: 5  10. Hyperlipidemia, unspecified hyperlipidemia type - Refill lovastatin (MEVACOR) 10 MG tablet; Take 1 tablet (10 mg total) by mouth at bedtime.  Dispense: 90 tablet; Refill: 3   Meds ordered this encounter  Medications  . fluocinonide ointment (LIDEX) 0.05 %    Sig: Apply 1 application topically 2 (two) times daily.    Dispense:  30 g    Refill:  0    Order Specific Question:   Supervising Provider    Answer:   Tresa Garter [id number]  . DISCONTD: linaclotide (LINZESS) [place]  capsule    Sig: Take 1 capsule (145 mcg total) by mouth daily before breakfast.    Dispense:  30 capsule    Refill:  5    Order Specific Question:   Supervising Provider    Answer:   Tresa Garter [id number]  . Exenatide ER 2 MG PEN    Sig: Inject 1 application into the skin once a week.    Dispense:  4 each    Refill:  11    Please dispense GLP1 that is discounted or free.    Order Specific Question:   Supervising Provider    Answer:   Tresa Garter [id number]  . glipiZIDE (GLUCOTROL) 5 MG tablet    Sig: Take 1 tablet (5 mg total) by mouth 2 (two) times daily before a meal.    Dispense:  180 tablet    Refill:  3    Order Specific Question:   Supervising Provider    Answer:   Tresa Garter [id number]  . linaclotide (LINZESS) 145 MCG CAPS capsule    Sig: Take 1 capsule (145 mcg total) by mouth daily before breakfast.    Dispense:  30 capsule    Refill:  5    Please give sample if available first.    Order Specific Question:   Supervising Provider    Answer:   Tresa Garter  [[id number]]  . lovastatin (MEVACOR) 10 MG tablet    Sig: Take 1 tablet (10 mg total) by mouth at bedtime.    Dispense:  90 tablet    Refill:  3    Order Specific Question:   Supervising Provider    Answer:   Tresa Garter [id number]  . metFORMIN (GLUCOPHAGE) 1000 MG tablet    Sig: Take 1 tablet (1,000 mg total) by mouth 2 (two) times daily with a meal.    Dispense:  180 tablet    Refill:  3    Order Specific Question:   Supervising Provider    Answer:   Tresa Garter [id number]  . metoprolol tartrate (LOPRESSOR) 25 MG tablet    Sig: Take 0.5 tablets (12.5 mg total) by mouth 2 (two) times daily.    Dispense:  180 tablet    Refill:  3    Order Specific Question:   Supervising Provider    Answer:   Tresa Garter [id number]    Follow-up: Return in about 4 weeks (around 03/01/2017).   Clent Demark PA

## 2017-02-01 NOTE — Patient Instructions (Signed)
Diabetes Mellitus and Food It is important for you to manage your blood sugar (glucose) level. Your blood glucose level can be greatly affected by what you eat. Eating healthier foods in the appropriate amounts throughout the day at about the same time each day will help you control your blood glucose level. It can also help slow or prevent worsening of your diabetes mellitus. Healthy eating may even help you improve the level of your blood pressure and reach or maintain a healthy weight. General recommendations for healthful eating and cooking habits include:  Eating meals and snacks regularly. Avoid going long periods of time without eating to lose weight.  Eating a diet that consists mainly of plant-based foods, such as fruits, vegetables, nuts, legumes, and whole grains.  Using low-heat cooking methods, such as baking, instead of high-heat cooking methods, such as deep frying.  Work with your dietitian to make sure you understand how to use the Nutrition Facts information on food labels. How can food affect me? Carbohydrates Carbohydrates affect your blood glucose level more than any other type of food. Your dietitian will help you determine how many carbohydrates to eat at each meal and teach you how to count carbohydrates. Counting carbohydrates is important to keep your blood glucose at a healthy level, especially if you are using insulin or taking certain medicines for diabetes mellitus. Alcohol Alcohol can cause sudden decreases in blood glucose (hypoglycemia), especially if you use insulin or take certain medicines for diabetes mellitus. Hypoglycemia can be a life-threatening condition. Symptoms of hypoglycemia (sleepiness, dizziness, and disorientation) are similar to symptoms of having too much alcohol. If your health care provider has given you approval to drink alcohol, do so in moderation and use the following guidelines:  Women should not have more than one drink per day, and men  should not have more than two drinks per day. One drink is equal to: ? 12 oz of beer. ? 5 oz of wine. ? 1 oz of hard liquor.  Do not drink on an empty stomach.  Keep yourself hydrated. Have water, diet soda, or unsweetened iced tea.  Regular soda, juice, and other mixers might contain a lot of carbohydrates and should be counted.  What foods are not recommended? As you make food choices, it is important to remember that all foods are not the same. Some foods have fewer nutrients per serving than other foods, even though they might have the same number of calories or carbohydrates. It is difficult to get your body what it needs when you eat foods with fewer nutrients. Examples of foods that you should avoid that are high in calories and carbohydrates but low in nutrients include:  Trans fats (most processed foods list trans fats on the Nutrition Facts label).  Regular soda.  Juice.  Candy.  Sweets, such as cake, pie, doughnuts, and cookies.  Fried foods.  What foods can I eat? Eat nutrient-rich foods, which will nourish your body and keep you healthy. The food you should eat also will depend on several factors, including:  The calories you need.  The medicines you take.  Your weight.  Your blood glucose level.  Your blood pressure level.  Your cholesterol level.  You should eat a variety of foods, including:  Protein. ? Lean cuts of meat. ? Proteins low in saturated fats, such as fish, egg whites, and beans. Avoid processed meats.  Fruits and vegetables. ? Fruits and vegetables that may help control blood glucose levels, such as apples,   mangoes, and yams.  Dairy products. ? Choose fat-free or low-fat dairy products, such as milk, yogurt, and cheese.  Grains, bread, pasta, and rice. ? Choose whole grain products, such as multigrain bread, whole oats, and brown rice. These foods may help control blood pressure.  Fats. ? Foods containing healthful fats, such as  nuts, avocado, olive oil, canola oil, and fish.  Does everyone with diabetes mellitus have the same meal plan? Because every person with diabetes mellitus is different, there is not one meal plan that works for everyone. It is very important that you meet with a dietitian who will help you create a meal plan that is just right for you. This information is not intended to replace advice given to you by your health care provider. Make sure you discuss any questions you have with your health care provider. Document Released: 02/24/2005 Document Revised: 11/05/2015 Document Reviewed: 04/26/2013 Elsevier Interactive Patient Education  2017 [employer]vier Inc.  

## 2017-02-01 NOTE — Addendum Note (Signed)
Addended by: Sindy Messing D on: 02/01/2017 02:57 PM   Modules accepted: Orders

## 2017-02-02 LAB — MICROALBUMIN / CREATININE URINE RATIO
Creatinine, Urine: 83.4 mg/dL
MICROALBUM., U, RANDOM: 4.6 ug/mL
Microalb/Creat Ratio: 5.5 mg/g creat (ref 0.0–30.0)

## 2017-02-02 LAB — COMPREHENSIVE METABOLIC PANEL
A/G RATIO: 1.5 (ref 1.2–2.2)
ALT: 8 IU/L (ref 0–44)
AST: 17 IU/L (ref 0–40)
Albumin: 4.6 g/dL (ref 3.6–4.8)
Alkaline Phosphatase: 53 IU/L (ref 39–117)
BILIRUBIN TOTAL: 0.3 mg/dL (ref 0.0–1.2)
BUN/Creatinine Ratio: 14 (ref 10–24)
BUN: 13 mg/dL (ref 8–27)
CALCIUM: 9.2 mg/dL (ref 8.6–10.2)
CO2: 21 mmol/L (ref 20–29)
Chloride: 99 mmol/L (ref 96–106)
Creatinine, Ser: 0.9 mg/dL (ref 0.76–1.27)
GFR calc Af Amer: 105 mL/min/{1.73_m2} (ref >59)
GFR, EST NON AFRICAN AMERICAN: 91 mL/min/{1.73_m2} (ref >59)
GLOBULIN, TOTAL: 3.1 g/dL (ref 1.5–4.5)
Glucose: 157 mg/dL — ABNORMAL HIGH (ref 65–99)
POTASSIUM: 5.2 mmol/L (ref 3.5–5.2)
SODIUM: 138 mmol/L (ref 134–144)
Total Protein: 7.7 g/dL (ref 6.0–8.5)

## 2017-02-02 LAB — HIV ANTIBODY (ROUTINE TESTING W REFLEX): HIV SCREEN 4TH GENERATION: NONREACTIVE

## 2017-02-02 LAB — HEPATITIS C ANTIBODY: HEP C VIRUS AB: 0.1 {s_co_ratio} (ref 0.0–0.9)

## 2017-02-02 LAB — TSH: TSH: 1.01 u[IU]/mL (ref 0.450–4.500)

## 2017-02-03 ENCOUNTER — Encounter: Payer: Self-pay | Admitting: Physician Assistant

## 2017-02-06 ENCOUNTER — Ambulatory Visit: Payer: Self-pay | Attending: Physician Assistant

## 2017-02-08 ENCOUNTER — Ambulatory Visit (INDEPENDENT_AMBULATORY_CARE_PROVIDER_SITE_OTHER): Payer: Self-pay | Admitting: Physician Assistant

## 2017-02-15 ENCOUNTER — Encounter: Payer: Self-pay | Admitting: Physical Therapy

## 2017-02-15 ENCOUNTER — Ambulatory Visit: Payer: Self-pay | Attending: Physician Assistant | Admitting: Physical Therapy

## 2017-02-15 DIAGNOSIS — M25512 Pain in left shoulder: Secondary | ICD-10-CM | POA: Insufficient documentation

## 2017-02-15 DIAGNOSIS — M25612 Stiffness of left shoulder, not elsewhere classified: Secondary | ICD-10-CM | POA: Insufficient documentation

## 2017-02-15 NOTE — Therapy (Signed)
[facility] [address] [place], [place], [address] Phone: [phone]   Fax:  [phone]  Physical Therapy Evaluation  Patient Details  Name: Jordan Bryant MRN: [id number] Date of Birth: March 06, 1956 Referring Provider: August Saucer  Encounter Date: 02/15/2017      PT End of Session - 02/15/17 1125    Visit Number 1   Date for PT Re-Evaluation 04/17/17   PT Start Time 1016   PT Stop Time 1100   PT Time Calculation (min) 44 min   Activity Tolerance Patient tolerated treatment well   Behavior During Therapy [facility] for tasks assessed/performed      Past Medical History:  Diagnosis Date  . Coronary artery disease   . Diabetes mellitus without complication (HCC)   . Stroke [facility])     Past Surgical History:  Procedure Laterality Date  . PROSTATE SURGERY      There were no vitals filed for this visit.       Subjective Assessment - 02/15/17 1023    Subjective Patient reports that he has been having left shoulder pain for a few months, he is unsure of a cause, reports some right shoulder pain starting.  X-rays negative.     Limitations House hold activities   Patient Stated Goals have less pain   Currently in Pain? Yes   Pain Score 0-No pain   Pain Location Shoulder   Pain Orientation Left;Posterior;Lateral   Pain Descriptors / Indicators Aching;Sharp   Pain Type Acute pain   Pain Onset More than a month ago   Pain Frequency Intermittent   Aggravating Factors  reaching, out and back very "sharp" pain a 9/10   Pain Relieving Factors at rest no pain   Effect of Pain on Daily Activities just difficulty reaching            [facility] PT Assessment - 02/15/17 0001      Assessment   Medical Diagnosis left shoulder pain   Referring Provider Dean   Onset Date/Surgical Date 01/15/17   Hand Dominance Right     Precautions   Precautions None     Balance Screen   Has the patient fallen in the past 6 months No   Has  the patient had a decrease in activity level because of a fear of falling?  No   Is the patient reluctant to leave their home because of a fear of falling?  No     Home Environment   Additional Comments no yardwork     Prior Function   Level of Independence Independent   Vocation Unemployed   Leisure no exercises     Posture/Postural Control   Posture Comments fwd head, rounded shoulders     ROM / Strength   AROM / PROM / Strength AROM;PROM;Strength     AROM   Overall AROM Comments very painful AROM of the left shoulder, some pain in the right shoulder at end ranges   AROM Assessment Site Shoulder   Right/Left Shoulder Right;Left   Right Shoulder Flexion 150 Degrees   Right Shoulder ABduction 160 Degrees   Right Shoulder Internal Rotation 65 Degrees   Right Shoulder External Rotation 85 Degrees   Left Shoulder Flexion 110 Degrees   Left Shoulder ABduction 80 Degrees   Left Shoulder Internal Rotation 25 Degrees   Left Shoulder External Rotation 50 Degrees     PROM   Overall PROM Comments left shoulder PROM very painful and cannot push past the  AROM by more than 5 degrees due to pain and tightness   PROM Assessment Site Shoulder   Right/Left Shoulder Right;Left   Right Shoulder Flexion 165 Degrees   Left Shoulder Flexion 115 Degrees   Left Shoulder ABduction 85 Degrees   Left Shoulder Internal Rotation 25 Degrees   Left Shoulder External Rotation 55 Degrees     Strength   Overall Strength Comments 4-/5 with pain in the available ROM for hte left shoulder     Palpation   Palpation comment non tender            Objective measurements completed on examination: See above findings.                  PT Education - 02/15/17 1125    Education provided Yes   Education Details AAROM to gain ROM    Person(s) Educated Patient   Methods Explanation;Demonstration;Handout   Comprehension Verbalized understanding;Returned demonstration          PT Short  Term Goals - 02/15/17 1129      PT SHORT TERM GOAL #1   Title independent with initial HEP   Time 2   Period Weeks   Status New           PT Long Term Goals - 02/15/17 1129      PT LONG TERM GOAL #1   Title decrease pain 25%   Time 8   Period Weeks   Status New     PT LONG TERM GOAL #2   Title increase AROM of the left shoulder to 60 degrees IR   Time 8   Period Weeks   Status New     PT LONG TERM GOAL #3   Title increase AROM of the left shoulder flexion to 140 degrees   Time 8   Period Weeks   Status New     PT LONG TERM GOAL #4   Title report no difficulty dressing   Time 8   Period Weeks   Status New                Plan - 02/15/17 1125    Clinical Impression Statement Patient reports left shoulder pain over about 2 months, he is unsure of any cause, x-rays were negative.  He has significant loss of ROM of the left shoulder with significant pain at his end ranges, PROM was about what the active motions were due to pain.  Specific tests of the shoulder were negative, he is non tender, seems to be adhesive capsulitis   Clinical Presentation Stable   Clinical Decision Making Low   Rehab Potential Good   PT Frequency 2x / week   PT Duration 8 weeks   PT Treatment/Interventions Cryotherapy;Electrical Stimulation;Iontophoresis 4mg /ml Dexamethasone;Moist Heat;Therapeutic activities;Therapeutic exercise;Manual techniques;Patient/family education   PT Next Visit Plan slowly advance ROM, strength and function   Consulted and Agree with Plan of Care Patient      Patient will benefit from skilled therapeutic intervention in order to improve the following deficits and impairments:  Decreased range of motion, Impaired UE functional use, Pain, Decreased strength, Postural dysfunction  Visit Diagnosis: Acute pain of left shoulder - Plan: PT plan of care cert/re-cert  Stiffness of left shoulder, not elsewhere classified - Plan: PT plan of care  cert/re-cert     Problem List Patient Active Problem List   Diagnosis Date Noted  . Type 2 diabetes mellitus without complication, with long-term current use of insulin (HCC) 11/22/2016  Jearld Lesch., PT 02/15/2017, 11:32 AM  [facility]- [address] [place] [address] [place], [place], [address] Phone: [phone]   Fax:  [phone]  Name: Jordan Bryant MRN: [id number] Date of Birth: [birth year]

## 2017-02-22 ENCOUNTER — Encounter: Payer: Self-pay | Admitting: Physical Therapy

## 2017-02-22 ENCOUNTER — Ambulatory Visit: Payer: Self-pay | Admitting: Physical Therapy

## 2017-02-22 DIAGNOSIS — M25612 Stiffness of left shoulder, not elsewhere classified: Secondary | ICD-10-CM

## 2017-02-22 DIAGNOSIS — M25512 Pain in left shoulder: Secondary | ICD-10-CM

## 2017-02-22 NOTE — Therapy (Signed)
[facility]- [place] [address] [place], [place], [address] Phone: [phone]   Fax:  [phone]  Physical Therapy Treatment  Patient Details  Name: Koby Pickup MRN: [id number] Date of Birth: 22-Sep-1955 Referring Provider: August Saucer  Encounter Date: 02/22/2017      PT End of Session - 02/22/17 1222    Visit Number 2   Date for PT Re-Evaluation 04/17/17   PT Start Time 1151   PT Stop Time 1230   PT Time Calculation (min) 39 min   Activity Tolerance Patient tolerated treatment well   Behavior During Therapy [facility] for tasks assessed/performed      Past Medical History:  Diagnosis Date  . Coronary artery disease   . Diabetes mellitus without complication (HCC)   . Stroke [facility])     Past Surgical History:  Procedure Laterality Date  . PROSTATE SURGERY      There were no vitals filed for this visit.      Subjective Assessment - 02/22/17 1151    Subjective Pt reports that he is doing good, still having some shoulder pain  L >R   Currently in Pain? No/denies   Pain Score 0-No pain                         [facility] Adult PT Treatment/Exercise - 02/22/17 0001      Exercises   Exercises Shoulder     Shoulder Exercises: Standing   External Rotation 10 reps;Theraband;Both  x2   Theraband Level (Shoulder External Rotation) Level 2 (Red)   Flexion 10 reps;Weights;Both   ABduction 10 reps;Both   Shoulder ABduction Weight (lbs) 1   Extension 10 reps;Theraband  x2   Theraband Level (Shoulder Extension) Level 2 (Red)   Row 10 reps;Theraband  x2   Theraband Level (Shoulder Row) Level 2 (Red)     Shoulder Exercises: ROM/Strengthening   UBE (Upper Arm Bike) L1 79frd/3rev     Modalities   Modalities Moist Heat     Moist Heat Therapy   Number Minutes Moist Heat 10 Minutes   Moist Heat Location Shoulder     Manual Therapy   Manual Therapy Passive ROM;Joint mobilization   Manual therapy comments  anterior delt tightness   Passive ROM L shoulder all directions                  PT Short Term Goals - 02/15/17 1129      PT SHORT TERM GOAL #1   Title independent with initial HEP   Time 2   Period Weeks   Status New           PT Long Term Goals - 02/15/17 1129      PT LONG TERM GOAL #1   Title decrease pain 25%   Time 8   Period Weeks   Status New     PT LONG TERM GOAL #2   Title increase AROM of the left shoulder to 60 degrees IR   Time 8   Period Weeks   Status New     PT LONG TERM GOAL #3   Title increase AROM of the left shoulder flexion to 140 degrees   Time 8   Period Weeks   Status New     PT LONG TERM GOAL #4   Title report no difficulty dressing   Time 8   Period Weeks   Status New  Plan - 02/22/17 1223    Clinical Impression Statement Pt ~ 6 minutes late for today's PT session. Pt with decrease motion with L shoulder abduction with 1lb weight and that motion decreased as he fatigues. Pt repots some anterior L shoulder pain with standing resisted rows.     Rehab Potential Good   PT Frequency 2x / week   PT Duration 8 weeks   PT Treatment/Interventions Cryotherapy;Electrical Stimulation;Iontophoresis 4mg /ml Dexamethasone;Moist Heat;Therapeutic activities;Therapeutic exercise;Manual techniques;Patient/family education   PT Next Visit Plan slowly advance ROM, strength and function      Patient will benefit from skilled therapeutic intervention in order to improve the following deficits and impairments:  Decreased range of motion, Impaired UE functional use, Pain, Decreased strength, Postural dysfunction  Visit Diagnosis: Acute pain of left shoulder  Stiffness of left shoulder, not elsewhere classified     Problem List Patient Active Problem List   Diagnosis Date Noted  . Type 2 diabetes mellitus without complication, with long-term current use of insulin (HCC) 11/22/2016    Grayce Sessionsonald G Tamre Cass,  PTA 02/22/2017, 12:29 PM  [facility] [address] [place], [place], [address] Phone: [phone]   Fax:  [phone]  Name: Ellouise NewerSaghir Dodds MRN: [id number] Date of Birth: 1956-03-02

## 2017-02-24 ENCOUNTER — Ambulatory Visit: Payer: Self-pay | Admitting: Physical Therapy

## 2017-02-27 ENCOUNTER — Encounter: Payer: Self-pay | Admitting: Physical Therapy

## 2017-02-27 ENCOUNTER — Ambulatory Visit: Payer: Self-pay | Admitting: Physical Therapy

## 2017-02-27 DIAGNOSIS — M25512 Pain in left shoulder: Secondary | ICD-10-CM

## 2017-02-27 DIAGNOSIS — M25612 Stiffness of left shoulder, not elsewhere classified: Secondary | ICD-10-CM

## 2017-02-27 NOTE — Therapy (Signed)
[facility] [address] [place], [place], [address] Phone: [phone]   Fax:  [phone]  Physical Therapy Treatment  Patient Details  Name: Jordan Bryant MRN: [id number] Date of Birth: Dec 09, 1955 Referring Provider: August Saucer  Encounter Date: 02/27/2017      PT End of Session - 02/27/17 1228    Visit Number 3   Date for PT Re-Evaluation 04/17/17   PT Start Time 1145   PT Stop Time 1235   PT Time Calculation (min) 50 min      Past Medical History:  Diagnosis Date  . Coronary artery disease   . Diabetes mellitus without complication (HCC)   . Stroke [facility])     Past Surgical History:  Procedure Laterality Date  . PROSTATE SURGERY      There were no vitals filed for this visit.      Subjective Assessment - 02/27/17 1152    Subjective Pt stated that he has some lateral L shoulder pain after last treatment, but took some ibuprofen and it helped   Currently in Pain? Yes   Pain Score 3    Pain Location Shoulder                         [facility] Adult PT Treatment/Exercise - 02/27/17 0001      Shoulder Exercises: Seated   Other Seated Exercises Rows and lats 25lc 2x10 each      Shoulder Exercises: Standing   Extension 15 reps;Theraband;Both  x2   Theraband Level (Shoulder Extension) Level 2 (Red)   Row 15 reps;Theraband  x2   Theraband Level (Shoulder Row) Level 2 (Red)   Other Standing Exercises 3 level cabinet reaches 2lb flex/ 0lb abd x10      Shoulder Exercises: ROM/Strengthening   UBE (Upper Arm Bike) L2 12frd/3rev                  PT Short Term Goals - 02/27/17 1231      PT SHORT TERM GOAL #1   Title independent with initial HEP   Status Achieved           PT Long Term Goals - 02/27/17 1231      PT LONG TERM GOAL #1   Title decrease pain 25%   Status On-going               Plan - 02/27/17 1229    Clinical Impression Statement Pt was unable to  complete level cabinet reaches with weight while doing L shoulder abduction. Pt UE fatigues quick with active abduction and flexion. Cues provided to maintain posture with standing shoulder flexion. Pt also requires constant cues to relax during MT.   Rehab Potential Good   PT Frequency 2x / week   PT Duration 8 weeks   PT Treatment/Interventions Cryotherapy;Electrical Stimulation;Iontophoresis /ml Dexamethasone;Moist Heat;Therapeutic activities;Therapeutic exercise;Manual techniques;Patient/family education   PT Next Visit Plan slowly advance ROM, strength and function      Patient will benefit from skilled therapeutic intervention in order to improve the following deficits and impairments:  Decreased range of motion, Impaired UE functional use, Pain, Decreased strength, Postural dysfunction  Visit Diagnosis: Acute pain of left shoulder  Stiffness of left shoulder, not elsewhere classified     Problem List Patient Active Problem List   Diagnosis Date Noted  . Type 2 diabetes mellitus without complication, with long-term current use of insulin (HCC) 11/22/2016    Macky Lower  Shari Prows, PTA 02/27/2017, 12:31 PM  [facility]- [place] [address] [place], [place], [address] Phone: [phone]   Fax:  [phone]  Name: Nasir Bright MRN: [id number] Date of Birth: 19-Aug-1955

## 2017-02-28 ENCOUNTER — Other Ambulatory Visit: Payer: Self-pay | Admitting: *Deleted

## 2017-02-28 DIAGNOSIS — K5900 Constipation, unspecified: Secondary | ICD-10-CM

## 2017-02-28 MED ORDER — LINACLOTIDE 72 MCG PO CAPS: 72.0000 ug | ORAL_CAPSULE | Freq: Every day | ORAL | 3 refills | Status: DC

## 2017-02-28 NOTE — Telephone Encounter (Signed)
PRINTED FOR PASS PROGRAM 

## 2017-03-01 ENCOUNTER — Encounter: Payer: Self-pay | Admitting: Physical Therapy

## 2017-03-01 ENCOUNTER — Ambulatory Visit: Payer: Self-pay | Admitting: Physical Therapy

## 2017-03-01 DIAGNOSIS — M25612 Stiffness of left shoulder, not elsewhere classified: Secondary | ICD-10-CM

## 2017-03-01 DIAGNOSIS — M25512 Pain in left shoulder: Secondary | ICD-10-CM

## 2017-03-01 MED FILL — LOVASTATIN 10 MG TABLET: 10 | 30 days supply | Qty: 30 | Fill #1

## 2017-03-01 MED FILL — METOPROLOL TARTRATE 25 MG T: 25 | 30 days supply | Qty: 30 | Fill #1

## 2017-03-01 MED FILL — glipiZIDE 5 MG TABS: 5 | 30 days supply | Qty: 60 | Fill #1

## 2017-03-01 NOTE — Therapy (Signed)
[facility] [place] [address] [place], [place], [address] Phone: [phone]   Fax:  [phone]  Physical Therapy Treatment  Patient Details  Name: Jordan Bryant MRN: [id number] Date of Birth: [birth year] Referring Provider: August Saucer  Encounter Date: 03/01/2017      PT End of Session - 03/01/17 1227    Date for PT Re-Evaluation 04/17/17   PT Start Time 1150   PT Stop Time 1232   PT Time Calculation (min) 42 min   Activity Tolerance Patient tolerated treatment well   Behavior During Therapy [facility] for tasks assessed/performed      Past Medical History:  Diagnosis Date  . Coronary artery disease   . Diabetes mellitus without complication (HCC)   . Stroke [facility])     Past Surgical History:  Procedure Laterality Date  . PROSTATE SURGERY      There were no vitals filed for this visit.      Subjective Assessment - 03/01/17 1153    Subjective Pt reports that he feels good, she stated that he only has pain in one angle.   Currently in Pain? No/denies   Pain Score 0-No pain                         [facility] Adult PT Treatment/Exercise - 03/01/17 0001      Shoulder Exercises: Seated   Other Seated Exercises Rows and lats 25lb 2x10 each      Shoulder Exercises: Standing   Flexion 10 reps;Weights;Both  x2   Shoulder Flexion Weight (lbs) 2   Other Standing Exercises 3 level cabinet reaches 2lb flex/ 0lb abd x10      Shoulder Exercises: ROM/Strengthening   UBE (Upper Arm Bike) L2 41frd/3rev     Modalities   Modalities Moist Heat     Moist Heat Therapy   Number Minutes Moist Heat 10 Minutes   Moist Heat Location Shoulder     Manual Therapy   Manual Therapy Joint mobilization;Soft tissue mobilization;Passive ROM;Manual Traction   Manual therapy comments anterior delt tightness   Joint Mobilization grades 3-4   Passive ROM L shoulder all directions   Manual Traction light distraction                   PT Short Term Goals - 02/27/17 1231      PT SHORT TERM GOAL #1   Title independent with initial HEP   Status Achieved           PT Long Term Goals - 02/27/17 1231      PT LONG TERM GOAL #1   Title decrease pain 25%   Status On-going               Plan - 03/01/17 1227    Clinical Impression Statement Pt ~ 5 minutes late for today's treatment, pt abel to do all of today's exercises, but shows signs of fatigue. Pt reports that he only has trouble taking his shirt off. Pt LUE is very tight with IR noted with MT.    Rehab Potential Good   PT Frequency 2x / week   PT Duration 8 weeks   PT Treatment/Interventions Cryotherapy;Electrical Stimulation;Iontophoresis /ml Dexamethasone;Moist Heat;Therapeutic activities;Therapeutic exercise;Manual techniques;Patient/family education   PT Next Visit Plan slowly advance ROM, strength and function      Patient will benefit from skilled therapeutic intervention in order to improve the following deficits and impairments:  Decreased range of motion,  Impaired UE functional use, Pain, Decreased strength, Postural dysfunction  Visit Diagnosis: Stiffness of left shoulder, not elsewhere classified  Acute pain of left shoulder     Problem List Patient Active Problem List   Diagnosis Date Noted  . Type 2 diabetes mellitus without complication, with long-term current use of insulin (HCC) 11/22/2016    Grayce Sessions, PTA 03/01/2017, 12:29 PM  [facility]- [place] [address] [place], [place], [address] Phone: [phone]   Fax:  [phone]  Name: Jordan Bryant MRN: [id number] Date of Birth: 1955/07/15

## 2017-03-06 ENCOUNTER — Ambulatory Visit: Payer: Self-pay | Admitting: Physical Therapy

## 2017-03-06 ENCOUNTER — Encounter: Payer: Self-pay | Admitting: Physical Therapy

## 2017-03-06 DIAGNOSIS — M25512 Pain in left shoulder: Secondary | ICD-10-CM

## 2017-03-06 DIAGNOSIS — M25612 Stiffness of left shoulder, not elsewhere classified: Secondary | ICD-10-CM

## 2017-03-06 NOTE — Therapy (Signed)
[facility] [place] [address] [place], [place], [address] Phone: [phone]   Fax:  [phone]  Physical Therapy Treatment  Patient Details  Name: Jordan Bryant MRN: [id number] Date of Birth: July 19, 1955 Referring Provider: August Saucer  Encounter Date: 03/06/2017      PT End of Session - 03/06/17 1138    Visit Number 4   Date for PT Re-Evaluation 04/17/17   PT Start Time 1100   PT Stop Time 1155   PT Time Calculation (min) 55 min   Activity Tolerance Patient tolerated treatment well   Behavior During Therapy [facility] for tasks assessed/performed      Past Medical History:  Diagnosis Date  . Coronary artery disease   . Diabetes mellitus without complication (HCC)   . Stroke [facility])     Past Surgical History:  Procedure Laterality Date  . PROSTATE SURGERY      There were no vitals filed for this visit.      Subjective Assessment - 03/06/17 1059    Subjective "Its good"   Currently in Pain? No/denies   Pain Score 0-No pain            [facility] PT Assessment - 03/06/17 0001      AROM   Left Shoulder Flexion 120 Degrees   Left Shoulder ABduction 83 Degrees   Left Shoulder Internal Rotation 30 Degrees   Left Shoulder External Rotation 60 Degrees                     [facility] Adult PT Treatment/Exercise - 03/06/17 0001      Shoulder Exercises: Supine   External Rotation 15 reps;Theraband;Strengthening   Theraband Level (Shoulder External Rotation) Level 1 (Yellow)   Internal Rotation 15 reps;Theraband   Theraband Level (Shoulder Internal Rotation) Level 1 (Yellow)   Flexion AROM;15 reps  stretching      Shoulder Exercises: ROM/Strengthening   UBE (Upper Arm Bike) L3.1 18frd/3rev     Modalities   Modalities Electrical Stimulation;Moist Heat     Moist Heat Therapy   Number Minutes Moist Heat 15 Minutes   Moist Heat Location Shoulder     Electrical Stimulation   Electrical Stimulation Location L  shoulder   Electrical Stimulation Action IFC   Electrical Stimulation Parameters sitting to pt tolerance   Electrical Stimulation Goals Pain     Manual Therapy   Manual Therapy Joint mobilization;Soft tissue mobilization;Passive ROM;Manual Traction   Manual therapy comments anterior delt tightness   Joint Mobilization grades 3-4   Soft tissue mobilization Anterior Delt   Passive ROM L shoulder all directions   Manual Traction light distraction                  PT Short Term Goals - 02/27/17 1231      PT SHORT TERM GOAL #1   Title independent with initial HEP   Status Achieved           PT Long Term Goals - 02/27/17 1231      PT LONG TERM GOAL #1   Title decrease pain 25%   Status On-going               Plan - 03/06/17 1139    Clinical Impression Statement Today's treatment mostly consisted of MT. PT very limited with L shoulder IR and Abduction. Tightness and tenderness noted with STM to anterior L deltoid. Little improvement with AROM.   Rehab Potential Good   PT Frequency  2x / week   PT Treatment/Interventions Cryotherapy;Electrical Stimulation;Iontophoresis /ml Dexamethasone;Moist Heat;Therapeutic activities;Therapeutic exercise;Manual techniques;Patient/family education   PT Next Visit Plan slowly advance ROM, strength and function      Patient will benefit from skilled therapeutic intervention in order to improve the following deficits and impairments:  Decreased range of motion, Impaired UE functional use, Pain, Decreased strength, Postural dysfunction  Visit Diagnosis: Stiffness of left shoulder, not elsewhere classified  Acute pain of left shoulder     Problem List Patient Active Problem List   Diagnosis Date Noted  . Type 2 diabetes mellitus without complication, with long-term current use of insulin (HCC) 11/22/2016    Grayce Sessions, PTA 03/06/2017, 11:41 AM  [facility]- [address] [place] [address] [place], [place], [address] Phone: [phone]   Fax:  [phone]  Name: Jordan Bryant MRN: [id number] Date of Birth: [birth year]

## 2017-03-09 ENCOUNTER — Ambulatory Visit: Payer: Self-pay | Admitting: Physical Therapy

## 2017-03-13 ENCOUNTER — Ambulatory Visit: Payer: Self-pay | Attending: Physician Assistant | Admitting: Physical Therapy

## 2017-03-13 ENCOUNTER — Encounter: Payer: Self-pay | Admitting: Physical Therapy

## 2017-03-13 DIAGNOSIS — M25612 Stiffness of left shoulder, not elsewhere classified: Secondary | ICD-10-CM | POA: Insufficient documentation

## 2017-03-13 DIAGNOSIS — M25512 Pain in left shoulder: Secondary | ICD-10-CM | POA: Insufficient documentation

## 2017-03-13 NOTE — Therapy (Signed)
[facility] [place] [address] [place], [place], [address] Phone: [phone]   Fax:  [phone]  Physical Therapy Treatment  Patient Details  Name: Jordan Bryant MRN: [id number] Date of Birth: 1955/11/26 Referring Provider: August Saucer  Encounter Date: 03/13/2017      PT End of Session - 03/13/17 1141    Visit Number 5   Date for PT Re-Evaluation 04/17/17   PT Start Time 1100   PT Stop Time 1152   PT Time Calculation (min) 52 min   Activity Tolerance Patient tolerated treatment well   Behavior During Therapy [facility] for tasks assessed/performed      Past Medical History:  Diagnosis Date  . Coronary artery disease   . Diabetes mellitus without complication (HCC)   . Stroke [facility])     Past Surgical History:  Procedure Laterality Date  . PROSTATE SURGERY      There were no vitals filed for this visit.      Subjective Assessment - 03/13/17 1105    Subjective "This week I did exercises at home, I feel a little bit better"   Currently in Pain? No/denies   Pain Score 0-No pain                         [facility] Adult PT Treatment/Exercise - 03/13/17 0001      Shoulder Exercises: Seated   Other Seated Exercises Rows and lats 25lb 2x10 each      Shoulder Exercises: Standing   External Rotation 10 reps;Theraband;Both  x2   Theraband Level (Shoulder External Rotation) Level 1 (Yellow)   Other Standing Exercises 3 level cabinet reaches 2lb flex/ 0lb abd x10      Shoulder Exercises: ROM/Strengthening   UBE (Upper Arm Bike) L3.1 29frd/3rev     Shoulder Exercises: Stretch   Corner Stretch 4 reps;10 seconds     Moist Heat Therapy   Number Minutes Moist Heat 10 Minutes   Moist Heat Location Shoulder     Manual Therapy   Passive ROM L shoulder all directions                  PT Short Term Goals - 02/27/17 1231      PT SHORT TERM GOAL #1   Title independent with initial HEP   Status Achieved            PT Long Term Goals - 02/27/17 1231      PT LONG TERM GOAL #1   Title decrease pain 25%   Status On-going               Plan - 03/13/17 1143    Clinical Impression Statement Pt with more difficulty with L shoulder abduction. Pt with tightness in the anterior delt pec area on L side. Multiple cues to get pt to relax.   Rehab Potential Good   PT Frequency 2x / week   PT Duration 8 weeks   PT Treatment/Interventions Cryotherapy;Electrical Stimulation;Iontophoresis /ml Dexamethasone;Moist Heat;Therapeutic activities;Therapeutic exercise;Manual techniques;Patient/family education   PT Next Visit Plan slowly advance ROM, strength and function      Patient will benefit from skilled therapeutic intervention in order to improve the following deficits and impairments:  Decreased range of motion, Impaired UE functional use, Pain, Decreased strength, Postural dysfunction  Visit Diagnosis: Stiffness of left shoulder, not elsewhere classified  Acute pain of left shoulder     Problem List Patient Active Problem List  Diagnosis Date Noted  . Type 2 diabetes mellitus without complication, with long-term current use of insulin (HCC) 11/22/2016    Grayce Sessions, PTA 03/13/2017, 11:44 AM  [facility]- [address] [place], [place], [address] Phone: [phone]   Fax:  [phone]  Name: Jordan Bryant MRN: [id number] Date of Birth: Jun 23, 1955

## 2017-03-15 ENCOUNTER — Encounter: Payer: Self-pay | Admitting: Physical Therapy

## 2017-03-15 ENCOUNTER — Ambulatory Visit: Payer: Self-pay | Admitting: Physical Therapy

## 2017-03-15 DIAGNOSIS — M25512 Pain in left shoulder: Secondary | ICD-10-CM

## 2017-03-15 DIAGNOSIS — M25612 Stiffness of left shoulder, not elsewhere classified: Secondary | ICD-10-CM

## 2017-03-15 NOTE — Therapy (Signed)
[facility] [place] [address] [place], [place], [address] Phone: [phone]   Fax:  [phone]  Physical Therapy Treatment  Patient Details  Name: Jordan Bryant MRN: [id number] Date of Birth: Jul 19, 1955 Referring Provider: August Saucer  Encounter Date: 03/15/2017      PT End of Session - 03/15/17 1141    Visit Number 6   Date for PT Re-Evaluation 04/17/17   PT Start Time 1100   PT Stop Time 1151   PT Time Calculation (min) 51 min      Past Medical History:  Diagnosis Date  . Coronary artery disease   . Diabetes mellitus without complication (HCC)   . Stroke [facility])     Past Surgical History:  Procedure Laterality Date  . PROSTATE SURGERY      There were no vitals filed for this visit.      Subjective Assessment - 03/15/17 1103    Subjective "This week was a lot better "   Currently in Pain? No/denies   Pain Score 0-No pain                         [facility] Adult PT Treatment/Exercise - 03/15/17 0001      Shoulder Exercises: Supine   Flexion 10 reps;Left;Weights   Shoulder Flexion Weight (lbs) 2     Shoulder Exercises: ROM/Strengthening   UBE (Upper Arm Bike) L3.1 44frd/3rev     Modalities   Modalities Moist Heat     Moist Heat Therapy   Number Minutes Moist Heat 10 Minutes   Moist Heat Location Shoulder     Manual Therapy   Manual Therapy Joint mobilization;Soft tissue mobilization;Passive ROM;Manual Traction;Muscle Energy Technique   Manual therapy comments anterior delt tightness   Joint Mobilization grades 3-4   Soft tissue mobilization Anterior Delt   Passive ROM L shoulder all directions   Manual Traction light distraction   Muscle Energy Technique contract relax                  PT Short Term Goals - 02/27/17 1231      PT SHORT TERM GOAL #1   Title independent with initial HEP   Status Achieved           PT Long Term Goals - 02/27/17 1231      PT LONG TERM  GOAL #1   Title decrease pain 25%   Status On-going               Plan - 03/15/17 1142    Clinical Impression Statement MT performed today, pt is very limited with abduction, internal, and external rotation. Pt has severe tightness in his anterior and lateral L deltoid. Some AROM improvements with contract relax techniques.   Rehab Potential Good   PT Frequency 2x / week   PT Duration 8 weeks   PT Treatment/Interventions Cryotherapy;Electrical Stimulation;Iontophoresis /ml Dexamethasone;Moist Heat;Therapeutic activities;Therapeutic exercise;Manual techniques;Patient/family education   PT Next Visit Plan slowly advance ROM, strength and function      Patient will benefit from skilled therapeutic intervention in order to improve the following deficits and impairments:  Decreased range of motion, Impaired UE functional use, Pain, Decreased strength, Postural dysfunction  Visit Diagnosis: Stiffness of left shoulder, not elsewhere classified  Acute pain of left shoulder     Problem List Patient Active Problem List   Diagnosis Date Noted  . Type 2 diabetes mellitus without complication, with long-term current use of  insulin (HCC) 11/22/2016    Grayce Sessions, PTA 03/15/2017, 11:46 AM  [facility] [place] [address] [place], [place], [address] Phone: [phone]   Fax:  [phone]  Name: Jordan Bryant MRN: [id number] Date of Birth: [birth year]

## 2017-03-20 ENCOUNTER — Ambulatory Visit: Payer: Self-pay | Admitting: Physical Therapy

## 2017-03-20 ENCOUNTER — Encounter: Payer: Self-pay | Admitting: Physical Therapy

## 2017-03-20 DIAGNOSIS — M25512 Pain in left shoulder: Secondary | ICD-10-CM

## 2017-03-20 DIAGNOSIS — M25612 Stiffness of left shoulder, not elsewhere classified: Secondary | ICD-10-CM

## 2017-03-20 NOTE — Therapy (Signed)
[facility]- [place] [address] [place], [place], [address] Phone: [phone]   Fax:  [phone]  Physical Therapy Treatment  Patient Details  Name: Jordan Bryant MRN: [id number] Date of Birth: May 19, 1956 Referring Provider: August Saucer  Encounter Date: 03/20/2017      PT End of Session - 03/20/17 1144    Visit Number 7   Date for PT Re-Evaluation 04/17/17   PT Start Time 1100   PT Stop Time 1144   PT Time Calculation (min) 44 min   Activity Tolerance Patient tolerated treatment well   Behavior During Therapy [facility] for tasks assessed/performed      Past Medical History:  Diagnosis Date  . Coronary artery disease   . Diabetes mellitus without complication (HCC)   . Stroke [facility])     Past Surgical History:  Procedure Laterality Date  . PROSTATE SURGERY      There were no vitals filed for this visit.      Subjective Assessment - 03/20/17 1109    Subjective Pt reports that his shoulder is ok except for a few angles.   Currently in Pain? No/denies   Pain Score 0-No pain            [facility] PT Assessment - 03/20/17 0001      AROM   Left Shoulder Flexion 130 Degrees   Left Shoulder ABduction 83 Degrees                     [facility] Adult PT Treatment/Exercise - 03/20/17 0001      Shoulder Exercises: Seated   Other Seated Exercises seated bent over rows 4lb 2x10; ext 2lb 2x10      Shoulder Exercises: Standing   Flexion AAROM;10 reps  ladder   ABduction 10 reps  ladder    Other Standing Exercises AAROM flex with cane x15      Shoulder Exercises: ROM/Strengthening   UBE (Upper Arm Bike) L3.1 52frd/3rev     Shoulder Exercises: Stretch   Corner Stretch 5 reps;10 seconds     Modalities   Modalities Ultrasound     Ultrasound   Ultrasound Location L shoulder   Ultrasound Parameters 1.2w/cm2   Ultrasound Goals Pain                  PT Short Term Goals - 02/27/17 1231      PT  SHORT TERM GOAL #1   Title independent with initial HEP   Status Achieved           PT Long Term Goals - 02/27/17 1231      PT LONG TERM GOAL #1   Title decrease pain 25%   Status On-going               Plan - 03/20/17 1146    Clinical Impression Statement Pt with a mild increase in RUE flexion. Reinforced the importance of stretching at home with pt. Repots some difficulty wit seated bent over rows. PT able to achieve a better stretch with AAROM compared to PROM.    Rehab Potential Good   PT Frequency 2x / week   PT Duration 8 weeks   PT Treatment/Interventions Cryotherapy;Electrical Stimulation;Iontophoresis /ml Dexamethasone;Moist Heat;Therapeutic activities;Therapeutic exercise;Manual techniques;Patient/family education   PT Next Visit Plan assess [place], progress with AAROM      Patient will benefit from skilled therapeutic intervention in order to improve the following deficits and impairments:  Decreased range of motion, Impaired UE  functional use, Pain, Decreased strength, Postural dysfunction  Visit Diagnosis: Acute pain of left shoulder  Stiffness of left shoulder, not elsewhere classified     Problem List Patient Active Problem List   Diagnosis Date Noted  . Type 2 diabetes mellitus without complication, with long-term current use of insulin (HCC) 11/22/2016    Grayce Sessions 03/20/2017, 11:48 AM  [facility] [place] [address] [place], [place], [address] Phone: [phone]   Fax:  [phone]  Name: Jordan Bryant MRN: [id number] Date of Birth: 02-25-1956

## 2017-03-22 ENCOUNTER — Encounter: Payer: Self-pay | Admitting: Physical Therapy

## 2017-03-22 ENCOUNTER — Ambulatory Visit: Payer: Self-pay | Admitting: Physical Therapy

## 2017-03-22 DIAGNOSIS — M25512 Pain in left shoulder: Secondary | ICD-10-CM

## 2017-03-22 DIAGNOSIS — M25612 Stiffness of left shoulder, not elsewhere classified: Secondary | ICD-10-CM

## 2017-03-22 NOTE — Therapy (Signed)
[facility] [place] [address] [place], [place], [address] Phone: [phone]   Fax:  [phone]  Physical Therapy Treatment  Patient Details  Name: Habeeb Puertas MRN: [id number] Date of Birth: July 25, 1955 Referring Provider: August Saucer  Encounter Date: 03/22/2017      PT End of Session - 03/22/17 1124    Visit Number 8   Date for PT Re-Evaluation 04/17/17   PT Start Time 1100   PT Stop Time 1124   PT Time Calculation (min) 24 min   Activity Tolerance Patient tolerated treatment well   Behavior During Therapy [facility] for tasks assessed/performed      Past Medical History:  Diagnosis Date  . Coronary artery disease   . Diabetes mellitus without complication (HCC)   . Stroke [facility])     Past Surgical History:  Procedure Laterality Date  . PROSTATE SURGERY      There were no vitals filed for this visit.      Subjective Assessment - 03/22/17 1101    Subjective "Its ok, same, no pain"   Currently in Pain? No/denies   Pain Score 0-No pain                         [facility] Adult PT Treatment/Exercise - 03/22/17 0001      Shoulder Exercises: Seated   Other Seated Exercises Rows and lats 25lb 2x15 each    Other Seated Exercises seated bent over rows 4lb 2x10; ext 2lb 2x10      Shoulder Exercises: Standing   Flexion AAROM;10 reps  ladder   ABduction 10 reps;AAROM  ladder     Shoulder Exercises: ROM/Strengthening   UBE (Upper Arm Bike) L3.1 66frd/3rev                  PT Short Term Goals - 02/27/17 1231      PT SHORT TERM GOAL #1   Title independent with initial HEP   Status Achieved           PT Long Term Goals - 02/27/17 1231      PT LONG TERM GOAL #1   Title decrease pain 25%   Status On-going               Plan - 03/22/17 1124    Clinical Impression Statement Pt reports that he had to leave early to [place] his [place] to the airport. All strengthening exercises performed  well. Minium effort put into AAROM with the ladder to achieve a grater motion despite cues.    Rehab Potential Good   PT Frequency 2x / week   PT Duration 8 weeks   PT Treatment/Interventions Cryotherapy;Electrical Stimulation;Iontophoresis /ml Dexamethasone;Moist Heat;Therapeutic activities;Therapeutic exercise;Manual techniques;Patient/family education   PT Next Visit Plan continue [place], progress with AAROM      Patient will benefit from skilled therapeutic intervention in order to improve the following deficits and impairments:  Decreased range of motion, Impaired UE functional use, Pain, Decreased strength, Postural dysfunction  Visit Diagnosis: Stiffness of left shoulder, not elsewhere classified  Acute pain of left shoulder     Problem List Patient Active Problem List   Diagnosis Date Noted  . Type 2 diabetes mellitus without complication, with long-term current use of insulin (HCC) 11/22/2016    Grayce Sessions, PTA 03/22/2017, 11:26 AM  [facility]- [address] [place] [address] [place], [place], [address] Phone: [phone]   Fax:  [phone]  Name: Jamal Pavon MRN: [id number] Date of Birth: 16-Sep-1955

## 2017-03-27 ENCOUNTER — Encounter: Payer: Self-pay | Admitting: Physical Therapy

## 2017-03-27 ENCOUNTER — Ambulatory Visit: Payer: Self-pay | Admitting: Physical Therapy

## 2017-03-27 DIAGNOSIS — M25612 Stiffness of left shoulder, not elsewhere classified: Secondary | ICD-10-CM

## 2017-03-27 DIAGNOSIS — M25512 Pain in left shoulder: Secondary | ICD-10-CM

## 2017-03-27 NOTE — Therapy (Signed)
[facility] [place] [address] [place], [place], [address] Phone: [phone]   Fax:  [phone]  Physical Therapy Treatment  Patient Details  Name: Jordan Bryant MRN: [id number] Date of Birth: 1955-09-29 Referring Provider: August Saucer  Encounter Date: 03/27/2017      PT End of Session - 03/27/17 1145    Visit Number 9   Date for PT Re-Evaluation 04/17/17   PT Start Time 1100   PT Stop Time 1146   PT Time Calculation (min) 46 min   Activity Tolerance Patient tolerated treatment well   Behavior During Therapy [facility] for tasks assessed/performed      Past Medical History:  Diagnosis Date  . Coronary artery disease   . Diabetes mellitus without complication (HCC)   . Stroke [facility])     Past Surgical History:  Procedure Laterality Date  . PROSTATE SURGERY      There were no vitals filed for this visit.      Subjective Assessment - 03/27/17 1104    Subjective "Pretty good, not bad"   Currently in Pain? No/denies   Pain Score 0-No pain                         [facility] Adult PT Treatment/Exercise - 03/27/17 0001      Shoulder Exercises: Seated   Other Seated Exercises Rows and lats 25lb 2x15 each      Shoulder Exercises: Standing   External Rotation 10 reps;Theraband;Left  x2   Theraband Level (Shoulder External Rotation) Level 1 (Yellow)   Internal Rotation Strengthening;Left;10 reps;Theraband  x2   Theraband Level (Shoulder Internal Rotation) Level 1 (Yellow)   Other Standing Exercises 3 level cabinet reaches flex & abd 2x10 each     Shoulder Exercises: ROM/Strengthening   UBE (Upper Arm Bike) L4.5 22frd/3rev     Ultrasound   Ultrasound Location L shoulder   Ultrasound Parameters 1.2w/cm2   Ultrasound Goals Pain                  PT Short Term Goals - 02/27/17 1231      PT SHORT TERM GOAL #1   Title independent with initial HEP   Status Achieved           PT Long Term  Goals - 02/27/17 1231      PT LONG TERM GOAL #1   Title decrease pain 25%   Status On-going               Plan - 03/27/17 1146    Clinical Impression Statement Pt with an improved ability to reach the upper cabined with standing L shoulder abduction. Pt with good form and control with both resisted ER and IR using LUE. Increase resistance tolerated with seated rows.   Rehab Potential Good   PT Frequency 2x / week   PT Duration 8 weeks   PT Treatment/Interventions Cryotherapy;Electrical Stimulation;Iontophoresis /ml Dexamethasone;Moist Heat;Therapeutic activities;Therapeutic exercise;Manual techniques;Patient/family education   PT Next Visit Plan continue [place], progress with AAROM      Patient will benefit from skilled therapeutic intervention in order to improve the following deficits and impairments:  Decreased range of motion, Impaired UE functional use, Pain, Decreased strength, Postural dysfunction  Visit Diagnosis: Stiffness of left shoulder, not elsewhere classified  Acute pain of left shoulder     Problem List Patient Active Problem List   Diagnosis Date Noted  . Type 2 diabetes mellitus without complication,  with long-term current use of insulin (HCC) 11/22/2016    Grayce Sessions, PTA 03/27/2017, 11:48 AM  [facility] [place] [address] [place], [place], [address] Phone: [phone]   Fax:  [phone]  Name: Jordan Bryant MRN: [id number] Date of Birth: 01-Nov-1955

## 2017-03-29 ENCOUNTER — Ambulatory Visit: Payer: Self-pay | Admitting: Physical Therapy

## 2017-03-31 ENCOUNTER — Telehealth (INDEPENDENT_AMBULATORY_CARE_PROVIDER_SITE_OTHER): Payer: Self-pay | Admitting: Physician Assistant

## 2017-03-31 MED FILL — glipiZIDE 5 MG TABS: 5 | 30 days supply | Qty: 60 | Fill #2

## 2017-03-31 MED FILL — LOVASTATIN 10 MG TABLET: 10 | 30 days supply | Qty: 30 | Fill #2

## 2017-03-31 MED FILL — ?METFORMIN HCL 1,000 MG TAB: 1000 | 30 days supply | Qty: 60 | Fill #1

## 2017-03-31 NOTE — Telephone Encounter (Signed)
Patient called requesting her rx  linaclotide (LINZESS) 72 MCG capsule  Sent to [facility]   Thank You

## 2017-04-03 ENCOUNTER — Ambulatory Visit: Payer: Self-pay | Admitting: Physical Therapy

## 2017-04-03 ENCOUNTER — Other Ambulatory Visit (INDEPENDENT_AMBULATORY_CARE_PROVIDER_SITE_OTHER): Payer: Self-pay | Admitting: Physician Assistant

## 2017-04-03 DIAGNOSIS — K5900 Constipation, unspecified: Secondary | ICD-10-CM

## 2017-04-03 MED ORDER — LINACLOTIDE 72 MCG PO CAPS: 72.0000 ug | ORAL_CAPSULE | Freq: Every day | ORAL | 3 refills | Status: DC

## 2017-04-03 NOTE — Telephone Encounter (Signed)
Patient aware. Maxximus Gotay S Joesph Marcy, CMA  

## 2017-04-03 NOTE — Telephone Encounter (Signed)
Linzess sent to [facility].

## 2017-04-03 NOTE — Telephone Encounter (Signed)
Patient aware that they are refills but they have been sent to the wrong the pharmacy, please send to [facility]. Jordan Bryant, CMA

## 2017-04-04 MED FILL — $LINZESS 72 MCG CAPSULE: 72 | 30 days supply | Qty: 30 | Fill #0

## 2017-04-05 ENCOUNTER — Ambulatory Visit: Payer: Self-pay | Admitting: Physical Therapy

## 2017-04-05 ENCOUNTER — Encounter: Payer: Self-pay | Admitting: Physical Therapy

## 2017-04-05 DIAGNOSIS — M25612 Stiffness of left shoulder, not elsewhere classified: Secondary | ICD-10-CM

## 2017-04-05 DIAGNOSIS — M25512 Pain in left shoulder: Secondary | ICD-10-CM

## 2017-04-05 NOTE — Therapy (Signed)
[place], [place], [address] Phone: [phone]   Fax:  [phone]  Physical Therapy Treatment  Patient Details  Name: Ibrahima Holberg MRN: [id number] Date of Birth: 03-08-1956 Referring Provider: Marlou Sa  Encounter Date: 04/05/2017      PT End of Session - 04/05/17 1142    Visit Number 10   Date for PT Re-Evaluation 04/17/17   PT Start Time 1100   PT Stop Time 1142   PT Time Calculation (min) 42 min   Activity Tolerance Patient tolerated treatment well   Behavior During Therapy [facility] for tasks assessed/performed      Past Medical History:  Diagnosis Date  . Coronary artery disease   . Diabetes mellitus without complication ([place])   . Stroke [facility])     Past Surgical History:  Procedure Laterality Date  . PROSTATE SURGERY      There were no vitals filed for this visit.      Subjective Assessment - 04/05/17 1058    Subjective reports that he is doing good, difficulty with horizontal abd/add   Currently in Pain? No/denies   Pain Score 0-No pain            [facility] PT Assessment - 04/05/17 0001      AROM   Left Shoulder Flexion 140 Degrees   Left Shoulder ABduction 100 Degrees                     [facility] Adult PT Treatment/Exercise - 04/05/17 0001      Shoulder Exercises: Seated   Other Seated Exercises Rows and lats 25lb 2x15 each      Shoulder Exercises: Standing   External Rotation Theraband;Left;15 reps   Theraband Level (Shoulder External Rotation) Level 1 (Yellow)   Internal Rotation Strengthening;Left;Theraband;20 reps   Theraband Level (Shoulder Internal Rotation) Level 1 (Yellow)   Extension Theraband;Both;20 reps  with ER    Theraband Level (Shoulder Extension) Level 1 (Yellow)   Other Standing Exercises 3 level cabinet reaches flex & abd 2x10 each     Shoulder Exercises: ROM/Strengthening   UBE (Upper Arm Bike) L7 75fd/3rev     Ultrasound   Ultrasound Location L shoulder   Ultrasound Parameters 1MHz 1.2wcm/2   Ultrasound Goals Pain                  PT Short Term Goals - 02/27/17 1231      PT SHORT TERM GOAL #1   Title independent with initial HEP   Status Achieved           PT Long Term Goals - 04/05/17 1143      PT LONG TERM GOAL #1   Title decrease pain 25%     PT LONG TERM GOAL #3   Title increase AROM of the left shoulder flexion to 140 degrees   Status Achieved     PT LONG TERM GOAL #4   Title report no difficulty dressing   Status Partially Met               Plan - 04/05/17 1142    Clinical Impression Statement Pt reports som improved mobility, Has increase his L shoulder AROM. Progresses with more more complex movements. Still some anterior shoulder tenderness and tightness remains.    Rehab Potential Good   PT Frequency 2x / week   PT Duration 8 weeks   PT Treatment/Interventions Cryotherapy;Electrical Stimulation;Iontophoresis 423mml Dexamethasone;Moist Heat;Therapeutic activities;Therapeutic exercise;Manual techniques;Patient/family  education   PT Next Visit Plan continue [place], progress with AAROM      Patient will benefit from skilled therapeutic intervention in order to improve the following deficits and impairments:  Decreased range of motion, Impaired UE functional use, Pain, Decreased strength, Postural dysfunction  Visit Diagnosis: Acute pain of left shoulder  Stiffness of left shoulder, not elsewhere classified     Problem List Patient Active Problem List   Diagnosis Date Noted  . Type 2 diabetes mellitus without complication, with long-term current use of insulin ([place]) 11/22/2016    Scot Jun, PTA 04/05/2017, 11:44 AM  [place], [place], [address] Phone: [phone]   Fax:  [phone]  Name: Demarri Elie MRN: [id number] Date of Birth: 1956-05-22

## 2017-04-10 ENCOUNTER — Encounter: Payer: Self-pay | Admitting: Physical Therapy

## 2017-04-10 ENCOUNTER — Ambulatory Visit: Payer: Self-pay | Admitting: Physical Therapy

## 2017-04-10 DIAGNOSIS — M25612 Stiffness of left shoulder, not elsewhere classified: Secondary | ICD-10-CM

## 2017-04-10 DIAGNOSIS — M25512 Pain in left shoulder: Secondary | ICD-10-CM

## 2017-04-10 NOTE — Therapy (Signed)
[facility] [place], [place], [address] Phone: [phone]   Fax:  [phone]  Physical Therapy Treatment  Patient Details  Name: Jordan Bryant MRN: [id number] Date of Birth: 1956/06/07 Referring Provider: Marlou Sa  Encounter Date: 04/10/2017      PT End of Session - 04/10/17 1137    Visit Number 11   Date for PT Re-Evaluation 04/17/17   PT Start Time 1100   PT Stop Time 1155   PT Time Calculation (min) 55 min   Activity Tolerance Patient tolerated treatment well   Behavior During Therapy [facility] for tasks assessed/performed      Past Medical History:  Diagnosis Date  . Coronary artery disease   . Diabetes mellitus without complication ([place])   . Stroke [facility])     Past Surgical History:  Procedure Laterality Date  . PROSTATE SURGERY      There were no vitals filed for this visit.      Subjective Assessment - 04/10/17 1107    Subjective "It is ok"   Currently in Pain? No/denies   Pain Score 0-No pain                         [facility] Adult PT Treatment/Exercise - 04/10/17 0001      Shoulder Exercises: Seated   Other Seated Exercises Rows and lats 25lb 2x15 each      Shoulder Exercises: Standing   Extension 10 reps;Theraband;Both  x2, with ER   Theraband Level (Shoulder Extension) Level 2 (Red)   Row Both;10 reps;Theraband;Strengthening  x2, with ER   Other Standing Exercises 3 level cabinet reaches flex & abd 1lb 2x10 each     Shoulder Exercises: ROM/Strengthening   UBE (Upper Arm Bike) L6 40fd/4rev                  PT Short Term Goals - 02/27/17 1231      PT SHORT TERM GOAL #1   Title independent with initial HEP   Status Achieved           PT Long Term Goals - 04/05/17 1143      PT LONG TERM GOAL #1   Title decrease pain 25%     PT LONG TERM GOAL #3   Title increase AROM of the left shoulder flexion to 140 degrees   Status Achieved     PT  LONG TERM GOAL #4   Title report no difficulty dressing   Status Partially Met               Plan - 04/10/17 1139    Clinical Impression Statement Added some compound movements with rows and ext, pt completed all exercises well. Performed lat pull downs las were pt was experiencing increase pain in anterior L shoulder.    Rehab Potential Good   PT Frequency 2x / week   PT Duration 8 weeks   PT Treatment/Interventions Cryotherapy;Electrical Stimulation;Iontophoresis '4mg'$ /ml Dexamethasone;Moist Heat;Therapeutic activities;Therapeutic exercise;Manual techniques;Patient/family education   PT Next Visit Plan continue [place] progress with stretching L shoulder abduction.      Patient will benefit from skilled therapeutic intervention in order to improve the following deficits and impairments:  Decreased range of motion, Impaired UE functional use, Pain, Decreased strength, Postural dysfunction  Visit Diagnosis: Acute pain of left shoulder  Stiffness of left shoulder, not elsewhere classified     Problem List Patient Active Problem List   Diagnosis Date  Noted  . Type 2 diabetes mellitus without complication, with long-term current use of insulin ([place]) 11/22/2016    Jordan Bryant, PTA 04/10/2017, 11:41 AM  [place], [place], [address] Phone: [phone]   Fax:  [phone]  Name: Jordan Bryant MRN: [id number] Date of Birth: 1955-12-24

## 2017-04-12 ENCOUNTER — Ambulatory Visit: Payer: Self-pay | Admitting: Physical Therapy

## 2017-04-28 MED FILL — ?METOPROLOL 25 MG TABLET: 25 | 30 days supply | Qty: 30 | Fill #2

## 2017-04-28 MED FILL — LOVASTATIN 10 MG TABLET: 10 | 30 days supply | Qty: 30 | Fill #3

## 2017-04-28 MED FILL — ?GLIPIZIDE 5MG TABLET: 5 | 30 days supply | Qty: 60 | Fill #3

## 2017-04-28 MED FILL — ?METFORMIN HCL 1,000 MG TAB: 1000 | 30 days supply | Qty: 60 | Fill #2

## 2017-05-18 ENCOUNTER — Encounter (INDEPENDENT_AMBULATORY_CARE_PROVIDER_SITE_OTHER): Payer: Self-pay | Admitting: Physician Assistant

## 2017-05-18 ENCOUNTER — Ambulatory Visit (INDEPENDENT_AMBULATORY_CARE_PROVIDER_SITE_OTHER): Payer: Self-pay | Admitting: Physician Assistant

## 2017-05-18 VITALS — BP 108/64 | HR 67 | Temp 98.3°F | Resp 18 | Ht 68.0 in | Wt 174.0 lb

## 2017-05-18 DIAGNOSIS — K648 Other hemorrhoids: Secondary | ICD-10-CM

## 2017-05-18 DIAGNOSIS — Z794 Long term (current) use of insulin: Secondary | ICD-10-CM

## 2017-05-18 DIAGNOSIS — E119 Type 2 diabetes mellitus without complications: Secondary | ICD-10-CM

## 2017-05-18 DIAGNOSIS — Z1211 Encounter for screening for malignant neoplasm of colon: Secondary | ICD-10-CM

## 2017-05-18 LAB — GLUCOSE, POCT (MANUAL RESULT ENTRY): POC GLUCOSE: 212 mg/dL — AB (ref 70–99)

## 2017-05-18 LAB — POCT GLYCOSYLATED HEMOGLOBIN (HGB A1C): Hemoglobin A1C: 6.7

## 2017-05-18 MED ORDER — HYDROCORTISONE ACETATE 25 MG RE SUPP: 25.0000 mg | Freq: Two times a day (BID) | RECTAL | 0 refills | Status: DC

## 2017-05-18 MED ORDER — ACETAMINOPHEN-CODEINE #3 300-30 MG PO TABS: 1.0000 | ORAL_TABLET | Freq: Four times a day (QID) | ORAL | 0 refills | Status: AC | PRN

## 2017-05-18 MED FILL — ACETAMINOPHEN/COD #3 TABLET: 300-30 | 14 days supply | Qty: 52 | Fill #0

## 2017-05-18 MED FILL — HYDROCORTISONE ACETATE 25 M: 25 | 6 days supply | Qty: 12 | Fill #0

## 2017-05-18 NOTE — Progress Notes (Signed)
Subjective:  Patient ID: Jordan Bryant, male    DOB: Oct 23, 1955  Age: 61 y.o. MRN: [id number]  CC: pain  HPI  Jordan Bryant is a 61 y.o. male with a medical history of CAD, DM2, and stroke presents with hemorrhoidal pain. Had hemorrhoid removed approximately 3 years ago. Says the pain is in the anus and exacerbated with sitting. He is not constipated any longer as he has been taking Linzess. Does not endorse bleeding, suppuration, abdominal pain, fever, chills, nausea, vomiting, fatigue, lightheadedness, night sweats, or unintentional weight loss.      Outpatient Medications Prior to Visit  Medication Sig Dispense Refill  . albuterol (PROVENTIL HFA;VENTOLIN HFA) 108 (90 Base) MCG/ACT inhaler Inhale 2 puffs into the lungs every 4 (four) hours as needed for wheezing or shortness of breath (cough, shortness of breath or wheezing.). 1 Inhaler 1  . aspirin 81 MG chewable tablet Chew by mouth daily.    Marland Kitchen azithromycin (ZITHROMAX) 250 MG tablet Two tablets together on day one. Then one tablet daily thereafter. 6 tablet 0  . Blood Glucose Monitoring Suppl (TRUE METRIX AIR GLUCOSE METER) w/Device KIT 1 each by Does not apply route as directed. 1 kit 0  . diclofenac (VOLTAREN) 75 MG EC tablet Take 1 tablet (75 mg total) by mouth 2 (two) times daily. 30 tablet 0  . Exenatide ER 2 MG PEN Inject 1 application into the skin once a week. 4 each 11  . fluocinonide ointment (LIDEX) 3.23 % Apply 1 application topically 2 (two) times daily. 30 g 0  . glipiZIDE (GLUCOTROL) 5 MG tablet Take 1 tablet (5 mg total) by mouth 2 (two) times daily before a meal. 180 tablet 3  . glucose blood test strip Use as instructed 100 each 12  . HYDROcodone-homatropine (HYDROMET) 5-1.5 MG/5ML syrup Take 5 mLs by mouth every 6 (six) hours as needed for cough. 120 mL 0  . Lancets MISC 1 each by Does not apply route 3 (three) times daily. 90 each 11  . linaclotide (LINZESS) 72 MCG capsule Take 1 capsule (72 mcg total) by mouth daily  before breakfast. 90 capsule 3  . lovastatin (MEVACOR) 10 MG tablet Take 1 tablet (10 mg total) by mouth at bedtime. 90 tablet 3  . metFORMIN (GLUCOPHAGE) 1000 MG tablet Take 1 tablet (1,000 mg total) by mouth 2 (two) times daily with a meal. 180 tablet 3  . metoprolol tartrate (LOPRESSOR) 25 MG tablet Take 0.5 tablets (12.5 mg total) by mouth 2 (two) times daily. 180 tablet 3  . naproxen (NAPROSYN) 500 MG tablet Take 1 tablet (500 mg total) by mouth 2 (two) times daily with a meal. 30 tablet 0   No facility-administered medications prior to visit.      ROS Review of Systems  Constitutional: Negative for chills, fever and malaise/fatigue.  Eyes: Negative for blurred vision.  Respiratory: Negative for shortness of breath.   Cardiovascular: Negative for chest pain and palpitations.  Gastrointestinal: Negative for abdominal pain, blood in stool, constipation, diarrhea, nausea and vomiting.  Genitourinary: Negative for dysuria and hematuria.  Musculoskeletal: Negative for joint pain and myalgias.  Skin: Negative for rash.  Neurological: Negative for tingling and headaches.  Psychiatric/Behavioral: Negative for depression. The patient is not nervous/anxious.     Objective:  BP 108/64 (BP Location: Left Arm, Patient Position: Sitting, Cuff Size: Normal)   Pulse 67   Temp 98.3 F (36.8 C) (Oral)   Resp 18   Ht 5' 8"  (1.727 m)  Wt 174 lb (78.9 kg)   SpO2 96%   BMI 26.46 kg/m   BP/Weight 05/18/2017 02/01/2017 9/83/3825  Systolic BP 053 976 734  Diastolic BP 64 81 79  Wt. (Lbs) 174 171.8 168.6  BMI 26.46 26.12 25.64      Physical Exam  Constitutional: He is oriented to person, place, and time.  Well developed, well nourished, NAD, polite  HENT:  Head: Normocephalic and atraumatic.  Eyes: Conjunctivae are normal. No scleral icterus.  Pulmonary/Chest: Effort normal.  Genitourinary:  Genitourinary Comments: Internal hemorrhoid at the 9 o' clock position.  Musculoskeletal: He  exhibits no edema.  Neurological: He is alert and oriented to person, place, and time. No cranial nerve deficit. Coordination normal.  Skin: Skin is warm and dry. No rash noted. No erythema. No pallor.  Psychiatric: He has a normal mood and affect. His behavior is normal. Thought content normal.  Vitals reviewed.    Assessment & Plan:    1. Internal hemorrhoid - hydrocortisone (ANUSOL-HC) 25 MG suppository; Place 1 suppository (25 mg total) rectally 2 (two) times daily.  Dispense: 12 suppository; Refill: 0 - acetaminophen-codeine (TYLENOL #3) 300-30 MG tablet; Take 1 tablet by mouth every 6 (six) hours as needed for up to 14 days for moderate pain.  Dispense: 52 tablet; Refill: 0 - Advised to call and report if hemorrhoid is still bothersome after treatment regimen.   2. Screening for colon cancer - Ambulatory referral to Gastroenterology  3. Type 2 diabetes mellitus without complication, with long-term current use of insulin ([facility]) - Glucose (CBG) 212 mg/dL in clinic today. - HgB A1c 6.7% in clinic today. Improved over the last A1c of 6.9% four months ago. - Continue on current medications.  Meds ordered this encounter  Medications  . hydrocortisone (ANUSOL-HC) 25 MG suppository    Sig: Place 1 suppository (25 mg total) rectally 2 (two) times daily.    Dispense:  12 suppository    Refill:  0    Order Specific Question:   Supervising Provider    Answer:   Tresa Garter [id number]  . acetaminophen-codeine (TYLENOL #3) 300-30 MG tablet    Sig: Take 1 tablet by mouth every 6 (six) hours as needed for up to 14 days for moderate pain.    Dispense:  52 tablet    Refill:  0    Order Specific Question:   Supervising Provider    Answer:   Tresa Garter [[id number]]    Follow-up: Return in about 3 months (around 08/16/2017) for DM.   Clent Demark PA

## 2017-05-18 NOTE — Patient Instructions (Signed)
How to Take a Sitz Bath °A sitz bath is a warm water bath that is taken while you are sitting down. The water should only come up to your hips and should cover your buttocks. Your health care provider may recommend a sitz bath to help you: °· Clean the lower part of your body, including your genital area. °· With itching. °· With pain. °· With sore muscles or muscles that tighten or spasm. ° °How to take a sitz bath °Take 3-4 sitz baths per day or as told by your health care provider. °1. Partially fill a bathtub with warm water. You will only need the water to be deep enough to cover your hips and buttocks when you are sitting in it. °2. If your health care provider told you to put medicine in the water, follow the directions exactly. °3. Sit in the water and open the tub drain a little. °4. Turn on the warm water again to keep the tub at the correct level. Keep the water running constantly. °5. Soak in the water for 15-20 minutes or as told by your health care provider. °6. After the sitz bath, pat the affected area dry first. Do not rub it. °7. Be careful when you stand up after the sitz bath because you may feel dizzy. ° °Contact a health care provider if: °· Your symptoms get worse. Do not continue with sitz baths if your symptoms get worse. °· You have new symptoms. Do not continue with sitz baths until you talk with your health care provider. °This information is not intended to replace advice given to you by your health care provider. Make sure you discuss any questions you have with your health care provider. °Document Released: 02/20/2004 Document Revised: 10/28/2015 Document Reviewed: 05/28/2014 °Elsevier Interactive Patient Education © 2018 [employer] ° °

## 2017-05-19 ENCOUNTER — Ambulatory Visit: Payer: Self-pay | Attending: Internal Medicine

## 2017-05-25 ENCOUNTER — Encounter: Payer: Self-pay | Admitting: Gastroenterology

## 2017-05-29 ENCOUNTER — Telehealth: Payer: Self-pay | Admitting: Physician Assistant

## 2017-05-29 MED FILL — TRUEplus LANCETS 28G MISC: 30 days supply | Qty: 100 | Fill #2

## 2017-05-29 MED FILL — TRUE METRIX TEST STRIP: 30 days supply | Qty: 100 | Fill #2

## 2017-05-29 MED FILL — ?GLIPIZIDE 5MG TABLET: 5 | 30 days supply | Qty: 60 | Fill #4

## 2017-05-29 MED FILL — $LINZESS 72 MCG CAPSULE: 72 | 30 days supply | Qty: 30 | Fill #1

## 2017-05-29 MED FILL — ?METOPROLOL 25 MG TABLET: 25 | 30 days supply | Qty: 30 | Fill #3

## 2017-05-29 MED FILL — LOVASTATIN 10 MG TABLET: 10 | 30 days supply | Qty: 30 | Fill #4

## 2017-05-29 MED FILL — ?METFORMIN HCL 1,000 MG TAB: 1000 | 30 days supply | Qty: 60 | Fill #3

## 2017-05-29 NOTE — Telephone Encounter (Signed)
Pt came in to request a call back in regards to his application.He states it was processed as two family members, whereas there is four. He States that with this  new information in his profile he might qualify.  Best phone number: [phone]. Please follow up

## 2017-06-07 NOTE — Telephone Encounter (Signed)
I  Confirmed with Ewell [facility] that we cannot increase the number of people in his household until he files taxes for 2018 and includes his dependents on that tax return.  I emailed this information to the patient.

## 2017-07-05 MED FILL — ?METFORMIN HCL 1,000 MG TAB: 1000 | 30 days supply | Qty: 60 | Fill #4

## 2017-07-05 MED FILL — $LINZESS 72 MCG CAPSULE: 72 | 30 days supply | Qty: 30 | Fill #2

## 2017-07-05 MED FILL — ?GLIPIZIDE 5MG TABLET: 5 | 30 days supply | Qty: 60 | Fill #5

## 2017-07-05 MED FILL — ?METOPROLOL 25 MG TABLET: 25 | 30 days supply | Qty: 30 | Fill #4

## 2017-07-05 MED FILL — LOVASTATIN 10 MG TABLET: 10 | 30 days supply | Qty: 30 | Fill #5

## 2017-07-10 ENCOUNTER — Ambulatory Visit (AMBULATORY_SURGERY_CENTER): Payer: Self-pay

## 2017-07-10 ENCOUNTER — Other Ambulatory Visit: Payer: Self-pay

## 2017-07-10 VITALS — Ht 68.0 in | Wt 176.4 lb

## 2017-07-10 DIAGNOSIS — Z1211 Encounter for screening for malignant neoplasm of colon: Secondary | ICD-10-CM

## 2017-07-10 MED ORDER — NA SULFATE-K SULFATE-MG SULF 17.5-3.13-1.6 GM/177ML PO SOLN: 1.0000 | Freq: Once | ORAL | 0 refills | Status: AC

## 2017-07-10 MED FILL — SUPREP BOWEL PREP KIT: 17.5-3.13-1 | 1 days supply | Qty: 354 | Fill #0

## 2017-07-10 NOTE — Progress Notes (Signed)
Denies allergies to eggs or soy products. Denies complication of anesthesia or sedation. Denies use of weight loss medication. Denies use of O2.   Emmi instructions declined.  

## 2017-07-24 ENCOUNTER — Encounter: Payer: Self-pay | Admitting: Gastroenterology

## 2017-08-01 MED FILL — ?METFORMIN HCL 1,000 MG TAB: 1000 | 30 days supply | Qty: 60 | Fill #5

## 2017-08-01 MED FILL — ?METOPROLOL 25 MG TABLET: 25 | 30 days supply | Qty: 30 | Fill #5

## 2017-08-01 MED FILL — ?GLIPIZIDE 5MG TABLET: 5 | 30 days supply | Qty: 60 | Fill #6

## 2017-08-01 MED FILL — LOVASTATIN 10 MG TABLET: 10 | 30 days supply | Qty: 30 | Fill #6

## 2017-08-01 MED FILL — $LINZESS 72 MCG CAPSULE: 72 | 30 days supply | Qty: 30 | Fill #3

## 2017-08-14 MED FILL — TRUE METRIX TEST STRIP: 30 days supply | Qty: 100 | Fill #3

## 2017-08-14 MED FILL — TRUEplus LANCETS 28G MISC: 30 days supply | Qty: 100 | Fill #3

## 2017-08-14 MED FILL — BYDUREON 2 MG PEN INJECT: 2 | 14 days supply | Qty: 2 | Fill #1

## 2017-08-16 ENCOUNTER — Ambulatory Visit (INDEPENDENT_AMBULATORY_CARE_PROVIDER_SITE_OTHER): Payer: Self-pay | Admitting: Physician Assistant

## 2017-08-17 ENCOUNTER — Encounter: Payer: Self-pay | Admitting: Gastroenterology

## 2017-08-18 ENCOUNTER — Ambulatory Visit (AMBULATORY_SURGERY_CENTER): Payer: Self-pay | Admitting: Gastroenterology

## 2017-08-18 ENCOUNTER — Encounter: Payer: Self-pay | Admitting: Gastroenterology

## 2017-08-18 ENCOUNTER — Other Ambulatory Visit: Payer: Self-pay

## 2017-08-18 VITALS — BP 132/82 | HR 64 | Temp 97.0°F | Resp 12 | Ht 68.0 in | Wt 176.0 lb

## 2017-08-18 DIAGNOSIS — Z1212 Encounter for screening for malignant neoplasm of rectum: Secondary | ICD-10-CM

## 2017-08-18 DIAGNOSIS — Z1211 Encounter for screening for malignant neoplasm of colon: Secondary | ICD-10-CM

## 2017-08-18 MED ORDER — SODIUM CHLORIDE 0.9 % IV SOLN: 500.0000 mL | Freq: Once | INTRAVENOUS | Status: AC

## 2017-08-18 NOTE — Op Note (Signed)
[facility] Patient Name: Jordan Bryant Procedure Date: 08/18/2017 9:23 AM MRN: [id number] Endoscopist: Sherilyn Cooter L. Myrtie Neither , MD Age: 62 Referring MD:  Date of Birth: [birth year] Gender: Male Account #: [id number] Procedure:                Colonoscopy Indications:              Screening for colorectal malignant neoplasm                            (reportedly normal colonoscopy > 10 years prior out                            of state) Medicines:                Monitored Anesthesia Care Procedure:                Pre-Anesthesia Assessment:                           - Prior to the procedure, a History and Physical                            was performed, and patient medications and                            allergies were reviewed. The patient's tolerance of                            previous anesthesia was also reviewed. The risks                            and benefits of the procedure and the sedation                            options and risks were discussed with the patient.                            All questions were answered, and informed consent                            was obtained. Anticoagulants: The patient has taken                            aspirin. It was decided not to withhold this                            medication prior to the procedure. ASA Grade                            Assessment: III - A patient with severe systemic                            disease. After reviewing the risks and benefits,  the patient was deemed in satisfactory condition to                            undergo the procedure.                           After obtaining informed consent, the colonoscope                            was passed under direct vision. Throughout the                            procedure, the patient's blood pressure, pulse, and                            oxygen saturations were monitored continuously. The   Colonoscope was introduced through the anus and                            advanced to the the cecum, identified by                            appendiceal orifice and ileocecal valve. The                            colonoscopy was performed without difficulty. The                            patient tolerated the procedure well. The quality                            of the bowel preparation was excellent. The                            ileocecal valve, appendiceal orifice, and rectum                            were photographed. Scope In: 9:30:12 AM Scope Out: 9:43:06 AM Scope Withdrawal Time: 0 hours 10 minutes 23 seconds  Total Procedure Duration: 0 hours 12 minutes 54 seconds  Findings:                 The perianal and digital rectal examinations were                            normal.                           The entire examined colon appeared normal on direct                            and retroflexion views. Complications:            No immediate complications. Estimated Blood Loss:     Estimated blood loss: none. Impression:               - The entire examined colon  is normal on direct and                            retroflexion views.                           - No specimens collected. Recommendation:           - Patient has a contact number available for                            emergencies. The signs and symptoms of potential                            delayed complications were discussed with the                            patient. Return to normal activities tomorrow.                            Written discharge instructions were provided to the                            patient.                           - Resume previous diet.                           - Continue present medications.                           - Repeat colonoscopy in 10 years for screening                            purposes. Abe Schools L. Myrtie Neither, MD 08/18/2017 9:49:49 AM This report has been signed  electronically.

## 2017-08-18 NOTE — Patient Instructions (Signed)
YOU HAD AN ENDOSCOPIC PROCEDURE TODAY AT THE [facility]:   Refer to the procedure report that was given to you for any specific questions about what was found during the examination.  If the procedure report does not answer your questions, please call your gastroenterologist to clarify.  If you requested that your care partner not be given the details of your procedure findings, then the procedure report has been included in a sealed envelope for you to review at your convenience later.  YOU SHOULD EXPECT: Some feelings of bloating in the abdomen. Passage of more gas than usual.  Walking can help get rid of the air that was put into your GI tract during the procedure and reduce the bloating. If you had a lower endoscopy (such as a colonoscopy or flexible sigmoidoscopy) you may notice spotting of blood in your stool or on the toilet paper. If you underwent a bowel prep for your procedure, you may not have a normal bowel movement for a few days.  Please Note:  You might notice some irritation and congestion in your nose or some drainage.  This is from the oxygen used during your procedure.  There is no need for concern and it should clear up in a day or so.  SYMPTOMS TO REPORT IMMEDIATELY:   Following lower endoscopy (colonoscopy or flexible sigmoidoscopy):  Excessive amounts of blood in the stool  Significant tenderness or worsening of abdominal pains  Swelling of the abdomen that is new, acute  Fever of 100F or higher   For urgent or emergent issues, a gastroenterologist can be reached at any hour by calling [phone].   DIET:  We do recommend a small meal at first, but then you may proceed to your regular diet.  Drink plenty of fluids but you should avoid alcoholic beverages for 24 hours.  ACTIVITY:  You should plan to take it easy for the rest of today and you should NOT DRIVE or use heavy machinery until tomorrow (because of the sedation medicines used during the test).     FOLLOW UP: Our staff will call the number listed on your records the next business day following your procedure to check on you and address any questions or concerns that you may have regarding the information given to you following your procedure. If we do not reach you, we will leave a message.  However, if you are feeling well and you are not experiencing any problems, there is no need to return our call.  We will assume that you have returned to your regular daily activities without incident.  If any biopsies were taken you will be contacted by phone or by letter within the next 1-3 weeks.  Please call us at [phone] if you have not heard about the biopsies in 3 weeks.    SIGNATURES/CONFIDENTIALITY: You and/or your care partner have signed paperwork which will be entered into your electronic medical record.  These signatures attest to the fact that that the information above on your After Visit Summary has been reviewed and is understood.  Full responsibility of the confidentiality of this discharge information lies with you and/or your care-partner.  Recall 10 years-2029 

## 2017-08-18 NOTE — Progress Notes (Signed)
To PACU, VSS. Report to [email or web address] 

## 2017-08-21 ENCOUNTER — Telehealth: Payer: Self-pay

## 2017-08-21 NOTE — Telephone Encounter (Signed)
  Follow up Call-  Call back number 08/18/2017  Post procedure Call Back phone  # [phone]  Permission to leave phone message Yes     Patient questions:  Do you have a fever, pain , or abdominal swelling? No. Pain Score  0 *  Have you tolerated food without any problems? Yes.    Have you been able to return to your normal activities? Yes.    Do you have any questions about your discharge instructions: Diet   No. Medications  No. Follow up visit  No.  Do you have questions or concerns about your Care? No.  Actions: * If pain score is 4 or above: No action needed, pain <4.  No problems noted per pt. maw

## 2017-08-23 ENCOUNTER — Ambulatory Visit (INDEPENDENT_AMBULATORY_CARE_PROVIDER_SITE_OTHER): Payer: Self-pay | Admitting: Physician Assistant

## 2017-08-23 ENCOUNTER — Other Ambulatory Visit: Payer: Self-pay

## 2017-08-23 ENCOUNTER — Encounter (INDEPENDENT_AMBULATORY_CARE_PROVIDER_SITE_OTHER): Payer: Self-pay | Admitting: Physician Assistant

## 2017-08-23 VITALS — BP 134/78 | HR 76 | Temp 97.4°F | Wt 174.2 lb

## 2017-08-23 DIAGNOSIS — Z794 Long term (current) use of insulin: Secondary | ICD-10-CM

## 2017-08-23 DIAGNOSIS — R209 Unspecified disturbances of skin sensation: Secondary | ICD-10-CM

## 2017-08-23 DIAGNOSIS — E119 Type 2 diabetes mellitus without complications: Secondary | ICD-10-CM

## 2017-08-23 MED ORDER — LANCETS MISC: 1.0000 | Freq: Three times a day (TID) | 11 refills | Status: DC

## 2017-08-23 MED ORDER — GLUCOSE BLOOD VI STRP: ORAL_STRIP | 12 refills | Status: DC

## 2017-08-23 MED ORDER — EXENATIDE ER 2 MG/0.85ML ~~LOC~~ AUIJ: 2.0000 mg | AUTO-INJECTOR | SUBCUTANEOUS | 11 refills | Status: DC

## 2017-08-23 MED ORDER — NIACIN 100 MG PO TABS: 100.0000 mg | ORAL_TABLET | Freq: Every day | ORAL | 5 refills | Status: DC

## 2017-08-23 MED FILL — TRUE METRIX TEST STRIP: 30 days supply | Qty: 100 | Fill #0

## 2017-08-23 MED FILL — TRUEplus LANCETS 28G MISC: 30 days supply | Qty: 100 | Fill #0

## 2017-08-23 NOTE — Progress Notes (Signed)
Subjective:  Patient ID: Jordan Bryant, male    DOB: May 31, 1956  Age: 62 y.o. MRN: [id number]  CC: cold hands and feet  HPI  Pradeep Spallis a 62 y.o.malewith a medical history of CAD, DM2, and stroke presents with cold hands and feet over the past two days. Hands and feet are cold until he begins activities which then warm his hands and feet. Does not endorse swelling, discoloration, malaise, arthralgias, myalgias, paresthesias, f/c/n/v, rash, CP, palpitations, SOB, HA, or GU sxs. Has constipation and is taking three forms of Magnesium capsules his son advised to take. Takes Linzess 72 mcg on occasion which reportedly works very well for him. Does not want to increase dose of Linzess nor does no request a refill at this moment. Does not endorse any other symptoms or complaints.   Outpatient Medications Prior to Visit  Medication Sig Dispense Refill  . aspirin 81 MG chewable tablet Chew by mouth daily.    . Blood Glucose Monitoring Suppl (TRUE METRIX AIR GLUCOSE METER) w/Device KIT 1 each by Does not apply route as directed. 1 kit 0  . fluocinonide ointment (LIDEX) 4.69 % Apply 1 application topically 2 (two) times daily. 30 g 0  . glipiZIDE (GLUCOTROL) 5 MG tablet Take 1 tablet (5 mg total) by mouth 2 (two) times daily before a meal. 180 tablet 3  . glucose blood test strip Use as instructed 100 each 12  . Lancets MISC 1 each by Does not apply route 3 (three) times daily. 90 each 11  . linaclotide (LINZESS) 72 MCG capsule Take 1 capsule (72 mcg total) by mouth daily before breakfast. 90 capsule 3  . lovastatin (MEVACOR) 10 MG tablet Take 1 tablet (10 mg total) by mouth at bedtime. 90 tablet 3  . metFORMIN (GLUCOPHAGE) 1000 MG tablet Take 1 tablet (1,000 mg total) by mouth 2 (two) times daily with a meal. 180 tablet 3   Facility-Administered Medications Prior to Visit  Medication Dose Route Frequency Provider Last Rate Last Dose  . 0.9 %  sodium chloride infusion  500 mL Intravenous Once  Nelida Meuse III, MD         ROS Review of Systems  Constitutional: Negative for chills, fever and malaise/fatigue.  Eyes: Negative for blurred vision.  Respiratory: Negative for shortness of breath.   Cardiovascular: Negative for chest pain and palpitations.  Gastrointestinal: Negative for abdominal pain and nausea.  Genitourinary: Negative for dysuria and hematuria.  Musculoskeletal: Negative for joint pain and myalgias.  Skin: Negative for rash.  Neurological: Negative for tingling and headaches.  Psychiatric/Behavioral: Negative for depression. The patient is not nervous/anxious.     Objective:  BP 134/78 (BP Location: Left Arm, Patient Position: Sitting, Cuff Size: Normal)   Pulse 76   Temp (!) 97.4 F (36.3 C) (Oral)   Wt 174 lb 3.2 oz (79 kg)   SpO2 96%   BMI 26.49 kg/m   BP/Weight 08/23/2017 08/18/2017 10/18/2255  Systolic BP 505 183 -  Diastolic BP 78 82 -  Wt. (Lbs) 174.2 176 176.4  BMI 26.49 26.76 26.82      Physical Exam  Constitutional: He is oriented to person, place, and time.  Well developed, well nourished, NAD, polite  HENT:  Head: Normocephalic and atraumatic.  Eyes: No scleral icterus.  Neck: Normal range of motion. Neck supple. No thyromegaly present.  Cardiovascular: Normal rate, regular rhythm and normal heart sounds.  Pulmonary/Chest: Effort normal and breath sounds normal.  Musculoskeletal: He exhibits no edema.  Neurological: He is alert and oriented to person, place, and time.  Hand grip strength 5/5 bilaterally  Skin: No rash noted. No erythema. No pallor.  Hands warm and dry with no edema or discoloration.   Psychiatric: He has a normal mood and affect. His behavior is normal. Thought content normal.  Vitals reviewed.    Assessment & Plan:   1. Cold hands and feet - Asymptomatic during encounter. No other associated symptoms. Appears as if there is a possibility of Raynaud's phenomenon but pt lacks other hallmark symptoms and does  not fit classic epidemiological profile. Will consider ANA if ESR and CRP are positive; also if symptoms persist. Hematological abnormalities may also be considered such as  cryoglobulinemia, cold agglutinin disease, paraproteinemia, POEMS syndrome, and cryofibrinogenemia. - CBC with Differential - Sedimentation Rate - C-reactive protein - Begin niacin 100 MG tablet; Take 1 tablet (100 mg total) by mouth at bedtime.  Dispense: 30 tablet; Refill: 5 - Magnesium - Basic Metabolic Panel  2. Type 2 diabetes mellitus without complication, with long-term current use of insulin (HCC) - Refill Lancets MISC; 1 each by Does not apply route 3 (three) times daily.  Dispense: 90 each; Refill: 11 - Refill glucose blood test strip; Use as instructed  Dispense: 100 each; Refill: 12 - Basic Metabolic Panel - Refill Exenatide ER 2 MG/0.85ML AUIJ; Inject 2 mg into the skin once a week.  Dispense: 4 pen; Refill: 11   Meds ordered this encounter  Medications  . Lancets MISC    Sig: 1 each by Does not apply route 3 (three) times daily.    Dispense:  90 each    Refill:  11    Order Specific Question:   Supervising Provider    Answer:   Tresa Garter [id number]  . glucose blood test strip    Sig: Use as instructed    Dispense:  100 each    Refill:  12    Order Specific Question:   Supervising Provider    Answer:   Tresa Garter [[id number]]  . niacin 100 MG tablet    Sig: Take 1 tablet (100 mg total) by mouth at bedtime.    Dispense:  30 tablet    Refill:  5    Order Specific Question:   Supervising Provider    Answer:   Tresa Garter [id number]  . Exenatide ER 2 MG/0.85ML AUIJ    Sig: Inject 2 mg into the skin once a week.    Dispense:  4 pen    Refill:  11    Order Specific Question:   Supervising Provider    Answer:   Tresa Garter [[id number]]    Follow-up: Return in about 3 months (around 11/23/2017), or if symptoms worsen or fail to improve.   Clent Demark  PA

## 2017-08-24 ENCOUNTER — Telehealth (INDEPENDENT_AMBULATORY_CARE_PROVIDER_SITE_OTHER): Payer: Self-pay

## 2017-08-24 LAB — CBC WITH DIFFERENTIAL/PLATELET
BASOS ABS: 0.1 10*3/uL (ref 0.0–0.2)
Basos: 1 %
EOS (ABSOLUTE): 0.3 10*3/uL (ref 0.0–0.4)
Eos: 4 %
Hematocrit: 45.3 % (ref 37.5–51.0)
Hemoglobin: 15.4 g/dL (ref 13.0–17.7)
Immature Grans (Abs): 0 10*3/uL (ref 0.0–0.1)
Immature Granulocytes: 0 %
LYMPHS ABS: 2.5 10*3/uL (ref 0.7–3.1)
Lymphs: 34 %
MCH: 27.7 pg (ref 26.6–33.0)
MCHC: 34 g/dL (ref 31.5–35.7)
MCV: 82 fL (ref 79–97)
MONOS ABS: 0.5 10*3/uL (ref 0.1–0.9)
Monocytes: 7 %
NEUTROS ABS: 4 10*3/uL (ref 1.4–7.0)
Neutrophils: 54 %
Platelets: 201 10*3/uL (ref 150–379)
RBC: 5.56 x10E6/uL (ref 4.14–5.80)
RDW: 13.8 % (ref 12.3–15.4)
WBC: 7.4 10*3/uL (ref 3.4–10.8)

## 2017-08-24 LAB — MAGNESIUM: Magnesium: 2.5 mg/dL — ABNORMAL HIGH (ref 1.6–2.3)

## 2017-08-24 LAB — SEDIMENTATION RATE: SED RATE: 30 mm/h (ref 0–30)

## 2017-08-24 LAB — C-REACTIVE PROTEIN: CRP: 0.9 mg/L (ref 0.0–4.9)

## 2017-08-24 NOTE — Telephone Encounter (Signed)
Patient aware of elevated magnesium and to stop taking so much magnesium supplement. Aware that inflammation marker on the upper end of normal. Aware that ANA will be performed if cold hands and feet persist. Jordan Frances Budd PalmerS Jordan Bryant, CMA

## 2017-08-24 NOTE — Telephone Encounter (Signed)
-----   Message from Loletta Specteroger David Gomez, PA-C sent at 08/24/2017 11:08 AM EDT ----- Magnesium mildly elevated. Stop taking so much magnesium supplementation. Inflammation marker is at upper limit of normal. I will study ANA if symptoms of cold hands and feet persist.

## 2017-08-28 MED FILL — $LINZESS 72 MCG CAPSULE: 72 | 30 days supply | Qty: 30 | Fill #4

## 2017-08-28 MED FILL — BYDUREON 2 MG PEN INJECT: 2 | 27 days supply | Qty: 4 | Fill #2

## 2017-08-28 MED FILL — LOVASTATIN 10 MG TABLET: 10 | 30 days supply | Qty: 30 | Fill #7

## 2017-08-28 MED FILL — ?METOPROLOL 25 MG TABLET: 25 | 30 days supply | Qty: 30 | Fill #6

## 2017-08-28 MED FILL — glipiZIDE 5 MG TABS: 5 | 30 days supply | Qty: 60 | Fill #7

## 2017-08-28 MED FILL — ?METFORMIN HCL 1,000 MG TAB: 1000 | 30 days supply | Qty: 60 | Fill #6

## 2017-09-06 ENCOUNTER — Ambulatory Visit: Payer: Self-pay | Attending: Physician Assistant

## 2017-09-26 MED FILL — ?METFORMIN HCL 1,000 MG TAB: 1000 | 30 days supply | Qty: 60 | Fill #7

## 2017-09-26 MED FILL — $LINZESS 72 MCG CAPSULE: 72 | 30 days supply | Qty: 30 | Fill #5

## 2017-09-26 MED FILL — glipiZIDE 5 MG TABS: 5 | 30 days supply | Qty: 60 | Fill #8

## 2017-09-26 MED FILL — LOVASTATIN 10 MG TABLET: 10 | 30 days supply | Qty: 30 | Fill #8

## 2017-09-26 MED FILL — METOPROLOL TARTRATE 25 MG T: 25 | 30 days supply | Qty: 30 | Fill #7

## 2017-10-02 ENCOUNTER — Telehealth (INDEPENDENT_AMBULATORY_CARE_PROVIDER_SITE_OTHER): Payer: Self-pay | Admitting: Physician Assistant

## 2017-10-02 NOTE — Telephone Encounter (Signed)
Patient called requesting to talk to pcp regarding his rx [place] and need a refills. I advise to contact his pharmacy they will sent the request to us and he said no work that way  . Please, call him back  Thank you

## 2017-10-02 NOTE — Telephone Encounter (Signed)
Patient stated he spoke with pharmacy and refills are there will be ready for pick up on Wednesday. Jordan Bryant S Syncere Kaminski, CMA

## 2017-10-03 MED FILL — TRUE METRIX TEST STRIP: 30 days supply | Qty: 100 | Fill #1

## 2017-10-03 MED FILL — ?METFORMIN HCL 1,000 MG TAB: 1000 | 30 days supply | Qty: 60 | Fill #8

## 2017-10-03 MED FILL — TRUEplus LANCETS 28G MISC: 30 days supply | Qty: 100 | Fill #1

## 2017-10-03 MED FILL — glipiZIDE 5 MG TABS: 5 | 30 days supply | Qty: 60 | Fill #9

## 2017-10-03 MED FILL — METOPROLOL TARTRATE 25 MG T: 25 | 30 days supply | Qty: 30 | Fill #8

## 2017-10-03 MED FILL — LOVASTATIN 10 MG TABLET: 10 | 30 days supply | Qty: 30 | Fill #9

## 2017-10-03 MED FILL — $LINZESS 72 MCG CAPSULE: 72 | 30 days supply | Qty: 30 | Fill #6

## 2017-12-01 MED FILL — glipiZIDE 5 MG TABS: 5 | 30 days supply | Qty: 60 | Fill #10

## 2017-12-01 MED FILL — metFORMIN HCL 1000 MG TABS: 1000 | 30 days supply | Qty: 60 | Fill #9

## 2017-12-01 MED FILL — $BYDUERON 2MG PEN INJECT: 2 | 28 days supply | Qty: 4 | Fill #3

## 2017-12-01 MED FILL — LOVASTATIN 10 MG TABLET: 10 | 30 days supply | Qty: 30 | Fill #10

## 2017-12-01 MED FILL — METOPROLOL TARTRATE 25 MG T: 25 | 30 days supply | Qty: 30 | Fill #9

## 2017-12-01 MED FILL — $LINZESS 72 MCG CAPSULE: 72 | 30 days supply | Qty: 30 | Fill #7

## 2017-12-01 MED FILL — TRUEplus LANCETS 28G MISC: 30 days supply | Qty: 100 | Fill #2

## 2017-12-01 MED FILL — TRUE METRIX TEST STRIP: 30 days supply | Qty: 100 | Fill #2

## 2018-01-09 MED FILL — LOVASTATIN 10 MG TABLET: 10 | 30 days supply | Qty: 30 | Fill #11

## 2018-01-09 MED FILL — TRUEplus LANCETS 28G MISC: 30 days supply | Qty: 100 | Fill #3

## 2018-01-09 MED FILL — METOPROLOL TARTRATE 25 MG T: 25 | 30 days supply | Qty: 30 | Fill #10

## 2018-01-09 MED FILL — metFORMIN HCL 1000 MG TABS: 1000 | 30 days supply | Qty: 60 | Fill #10

## 2018-01-09 MED FILL — $BYDUERON 2MG PEN INJECT: 2 | 28 days supply | Qty: 4 | Fill #4

## 2018-01-09 MED FILL — glipiZIDE 5 MG TABS: 5 | 30 days supply | Qty: 60 | Fill #11

## 2018-01-09 MED FILL — TRUE METRIX TEST STRIP: 30 days supply | Qty: 100 | Fill #3

## 2018-01-16 ENCOUNTER — Ambulatory Visit: Payer: Self-pay | Attending: Physician Assistant

## 2018-01-29 ENCOUNTER — Ambulatory Visit: Payer: Self-pay | Attending: Physician Assistant

## 2018-02-02 ENCOUNTER — Other Ambulatory Visit (INDEPENDENT_AMBULATORY_CARE_PROVIDER_SITE_OTHER): Payer: Self-pay | Admitting: Physician Assistant

## 2018-02-02 DIAGNOSIS — Z760 Encounter for issue of repeat prescription: Secondary | ICD-10-CM

## 2018-02-02 DIAGNOSIS — E785 Hyperlipidemia, unspecified: Secondary | ICD-10-CM

## 2018-02-02 DIAGNOSIS — E119 Type 2 diabetes mellitus without complications: Secondary | ICD-10-CM

## 2018-02-02 DIAGNOSIS — Z794 Long term (current) use of insulin: Principal | ICD-10-CM

## 2018-02-02 MED FILL — TRUEplus LANCETS 28G MISC: 30 days supply | Qty: 100 | Fill #4

## 2018-02-02 MED FILL — TRUE METRIX TEST STRIP: 30 days supply | Qty: 100 | Fill #4

## 2018-02-02 MED FILL — $LINZESS 72 MCG CAPSULE: 72 | 30 days supply | Qty: 30 | Fill #8

## 2018-02-02 NOTE — Telephone Encounter (Signed)
FWD to PCP. Jordan Bryant [name], CMA  

## 2018-02-05 ENCOUNTER — Other Ambulatory Visit (INDEPENDENT_AMBULATORY_CARE_PROVIDER_SITE_OTHER): Payer: Self-pay | Admitting: Physician Assistant

## 2018-02-05 DIAGNOSIS — Z760 Encounter for issue of repeat prescription: Secondary | ICD-10-CM

## 2018-02-05 MED FILL — ?GLIPIZIDE 5MG TABLET: 5 | 30 days supply | Qty: 60 | Fill #0

## 2018-02-05 MED FILL — LOVASTATIN 10 MG TABLET: 10 | 30 days supply | Qty: 30 | Fill #0

## 2018-02-05 MED FILL — ?METFORMIN HCL 1000MG TABS: 1000 | 30 days supply | Qty: 60 | Fill #0

## 2018-02-05 NOTE — Telephone Encounter (Signed)
FWD to PCP. Jordan Bryant S Jordan Bryant, CMA  

## 2018-02-06 ENCOUNTER — Encounter (INDEPENDENT_AMBULATORY_CARE_PROVIDER_SITE_OTHER): Payer: Self-pay | Admitting: Physician Assistant

## 2018-02-06 ENCOUNTER — Other Ambulatory Visit: Payer: Self-pay

## 2018-02-06 ENCOUNTER — Ambulatory Visit (INDEPENDENT_AMBULATORY_CARE_PROVIDER_SITE_OTHER): Payer: Self-pay | Admitting: Physician Assistant

## 2018-02-06 VITALS — BP 124/78 | HR 80 | Temp 97.6°F | Ht 68.0 in | Wt 169.6 lb

## 2018-02-06 DIAGNOSIS — E119 Type 2 diabetes mellitus without complications: Secondary | ICD-10-CM

## 2018-02-06 DIAGNOSIS — Z794 Long term (current) use of insulin: Secondary | ICD-10-CM

## 2018-02-06 DIAGNOSIS — E7841 Elevated Lipoprotein(a): Secondary | ICD-10-CM

## 2018-02-06 DIAGNOSIS — Z23 Encounter for immunization: Secondary | ICD-10-CM

## 2018-02-06 DIAGNOSIS — R200 Anesthesia of skin: Secondary | ICD-10-CM

## 2018-02-06 LAB — POCT GLYCOSYLATED HEMOGLOBIN (HGB A1C): HEMOGLOBIN A1C: 7.4 % — AB (ref 4.0–5.6)

## 2018-02-06 MED ORDER — GABAPENTIN 100 MG PO CAPS: 100.0000 mg | ORAL_CAPSULE | Freq: Three times a day (TID) | ORAL | 1 refills | Status: DC

## 2018-02-06 MED ORDER — DAPAGLIFLOZIN PROPANEDIOL 10 MG PO TABS: 10.0000 mg | ORAL_TABLET | Freq: Every day | ORAL | 5 refills | Status: DC

## 2018-02-06 MED ORDER — METOPROLOL TARTRATE 25 MG PO TABS: 12.5000 mg | ORAL_TABLET | Freq: Two times a day (BID) | ORAL | 3 refills | Status: DC

## 2018-02-06 MED FILL — ?METOPROLOL TARTRATE 25 MG: 25 | 30 days supply | Qty: 30 | Fill #0

## 2018-02-06 MED FILL — GABAPENTIN 100 MG CAPSULE: 100 | 30 days supply | Qty: 90 | Fill #0

## 2018-02-06 MED FILL — FARXIGA 10 MG TABLET: 10 | 30 days supply | Qty: 30 | Fill #0

## 2018-02-06 MED FILL — $BYDUERON 2MG PEN INJECT: 2 | 84 days supply | Qty: 12 | Fill #0

## 2018-02-06 NOTE — Progress Notes (Signed)
Subjective:  Patient ID: Jordan Bryant, male    DOB: 1955/06/18  Age: 62 y.o. MRN: [id number]  CC: left leg numbness and uncontrolled DM  HPI  Jordan Bryant a 62 y.o.malewith a medical history of CAD, DM2, HLD, and strokepresents with left leg numbness since approximately 2 - 3 months ago. Located at the lateral left thigh and lateral left calf. Numbness waxes and wanes. No association to activities. Denies trauma/injury, pain of the legs, weakness, paralysis, back pain, limited aROM, swelling, erythema, LUE paresthesia/weakness, HA, CP, excessive walking/running, fatigue, speech deficits, or visual deficits out of baseline (wears reading glasses).     Has been noticing higher glucose readings over the past 5 weeks. Glucose readings ranging from 160 - 220s. Last A1c 6.7% 8 months ago. A1c 7.4% today. Pt does not know why his blood sugar readings vary from one day to the next if he eats the same food. Does not endorse polydipsia, polyuria, fatigue, tingling of the feet, abdominal pain.    Outpatient Medications Prior to Visit  Medication Sig Dispense Refill  . aspirin 81 MG chewable tablet Chew by mouth daily.    . Blood Glucose Monitoring Suppl (TRUE METRIX AIR GLUCOSE METER) w/Device KIT 1 each by Does not apply route as directed. 1 kit 0  . Exenatide ER 2 MG/0.85ML AUIJ Inject 2 mg into the skin once a week. 4 pen 11  . fluocinonide ointment (LIDEX) 6.38 % Apply 1 application topically 2 (two) times daily. 30 g 0  . glipiZIDE (GLUCOTROL) 5 MG tablet TAKE 1 TABLET (5 MG TOTAL) BY MOUTH 2 (TWO) TIMES DAILY BEFORE A MEAL. 180 tablet 3  . glucose blood test strip Use as instructed 100 each 12  . Lancets MISC 1 each by Does not apply route 3 (three) times daily. 90 each 11  . linaclotide (LINZESS) 72 MCG capsule Take 1 capsule (72 mcg total) by mouth daily before breakfast. 90 capsule 3  . lovastatin (MEVACOR) 10 MG tablet TAKE 1 TABLET (10 MG TOTAL) BY MOUTH AT BEDTIME. 90 tablet 3  .  metFORMIN (GLUCOPHAGE) 1000 MG tablet TAKE 1 TABLET (1,000 MG TOTAL) BY MOUTH 2 (TWO) TIMES DAILY WITH A MEAL. 180 tablet 3  . niacin 100 MG tablet Take 1 tablet (100 mg total) by mouth at bedtime. 30 tablet 5   Facility-Administered Medications Prior to Visit  Medication Dose Route Frequency Provider Last Rate Last Dose  . 0.9 %  sodium chloride infusion  500 mL Intravenous Once Nelida Meuse III, MD         ROS Review of Systems  Constitutional: Negative for chills, fever and malaise/fatigue.  Eyes: Negative for blurred vision.  Respiratory: Negative for shortness of breath.   Cardiovascular: Negative for chest pain and palpitations.  Gastrointestinal: Negative for abdominal pain and nausea.  Genitourinary: Negative for dysuria and hematuria.  Musculoskeletal: Negative for joint pain and myalgias.  Skin: Negative for rash.  Neurological: Negative for tingling, focal weakness, weakness and headaches.       Numbness of the lateral left thigh and lateral left calf.  Psychiatric/Behavioral: Negative for depression. The patient is not nervous/anxious.     Objective:  Ht 5' 8"  (1.727 m)   Wt 169 lb 9.6 oz (76.9 kg)   BMI 25.79 kg/m   Vitals:   02/06/18 1344  BP: 124/78  Pulse: 80  Temp: 97.6 F (36.4 C)  TempSrc: Oral  SpO2: 94%  Weight: 169 lb 9.6 oz (76.9 kg)  Height:  5' 8"  (1.727 m)     Physical Exam  Constitutional: He is oriented to person, place, and time.  Well developed, well nourished, NAD, polite  HENT:  Head: Normocephalic and atraumatic.  Eyes: No scleral icterus.  Neck: Normal range of motion. Neck supple. No thyromegaly present.  Cardiovascular: Normal rate, regular rhythm and normal heart sounds.  Pulmonary/Chest: Effort normal and breath sounds normal.  Abdominal: Soft. Bowel sounds are normal. There is no tenderness.  Musculoskeletal: He exhibits no edema or deformity.  Neurological: He is alert and oriented to person, place, and time.  Skin: Skin  is warm and dry. No rash noted. No erythema. No pallor.  Psychiatric: He has a normal mood and affect. His behavior is normal. Thought content normal.  Vitals reviewed.    Assessment & Plan:   1. Type 2 diabetes mellitus without complication, with long-term current use of insulin (HCC) - HgB A1c 7.4%  - Comprehensive metabolic panel; Future - Microalbumin / creatinine urine ratio - Begin dapagliflozin propanediol (FARXIGA) 10 MG TABS tablet; Take 10 mg by mouth daily.  Dispense: 30 tablet; Refill: 5 - Begin metoprolol tartrate (LOPRESSOR) 25 MG tablet; Take 0.5 tablets (12.5 mg total) by mouth 2 (two) times daily.  Dispense: 90 tablet; Refill: 3 - Ambulatory referral to Ophthalmology  2. Elevated lipoprotein(a - Lipid panel; Future  3. Need for pneumococcal vaccination - Administered Pneumococcal polysaccharide vaccine 23-valent greater than or equal to 2yo subcutaneous/IM   4. Left leg numbness - Etiology unknown at this point. Distribution suggests involvement of the sciatic nerve and common fibular. Left lateral thigh numbness may also suggest femoral nerve entrapment but this is less likely since left lateral calf is involved. Doubt diabetic neuropathy as the symptoms are proximal with no involvement of feet. No trauma/injury, no associated pain in the legs or back. No HA, LUE paresthesia, weakness, paralysis. No visual deficit, fatigue, or common epidemiological age to suggest MS. ESR and CRP negative 5 months ago. Advised patient to not wear any tight fitting belts/pants so as to avoid possible issues with meralgia paresthetica. Lastly patient not active enough to include IT Band syndrome; negative physical exam for ITB syndrome. - Begin gabapentin (NEURONTIN) 100 MG capsule; Take 1 capsule (100 mg total) by mouth 3 (three) times daily.  Dispense: 90 capsule; Refill: 1. Instructed patient to begin taking one tablet qhs and progress to afternoon and morning doses over time.   5. Need  for prophylactic vaccination and inoculation against influenza - Administered. Flu Vaccine QUAD 6+ mos PF IM (Fluarix Quad PF)   Meds ordered this encounter  Medications  . dapagliflozin propanediol (FARXIGA) 10 MG TABS tablet    Sig: Take 10 mg by mouth daily.    Dispense:  30 tablet    Refill:  5    Order Specific Question:   Supervising Provider    Answer:   Charlott Rakes [[id number]]  . metoprolol tartrate (LOPRESSOR) 25 MG tablet    Sig: Take 0.5 tablets (12.5 mg total) by mouth 2 (two) times daily.    Dispense:  90 tablet    Refill:  3    Order Specific Question:   Supervising Provider    Answer:   Charlott Rakes [[id number]]  . gabapentin (NEURONTIN) 100 MG capsule    Sig: Take 1 capsule (100 mg total) by mouth 3 (three) times daily.    Dispense:  90 capsule    Refill:  1    Order Specific Question:   Supervising  Provider    Answer:   Charlott Rakes [[id number]]    Follow-up: Return in about 3 months (around 05/09/2018) for diabetes.   Clent Demark PA

## 2018-02-06 NOTE — Patient Instructions (Signed)
Diabetes Mellitus and Nutrition When you have diabetes (diabetes mellitus), it is very important to have healthy eating habits because your blood sugar (glucose) levels are greatly affected by what you eat and drink. Eating healthy foods in the appropriate amounts, at about the same times every day, can help you:  Control your blood glucose.  Lower your risk of heart disease.  Improve your blood pressure.  Reach or maintain a healthy weight.  Every person with diabetes is different, and each person has different needs for a meal plan. Your health care provider may recommend that you work with a diet and nutrition specialist (dietitian) to make a meal plan that is best for you. Your meal plan may vary depending on factors such as:  The calories you need.  The medicines you take.  Your weight.  Your blood glucose, blood pressure, and cholesterol levels.  Your activity level.  Other health conditions you have, such as heart or kidney disease.  How do carbohydrates affect me? Carbohydrates affect your blood glucose level more than any other type of food. Eating carbohydrates naturally increases the amount of glucose in your blood. Carbohydrate counting is a method for keeping track of how many carbohydrates you eat. Counting carbohydrates is important to keep your blood glucose at a healthy level, especially if you use insulin or take certain oral diabetes medicines. It is important to know how many carbohydrates you can safely have in each meal. This is different for every person. Your dietitian can help you calculate how many carbohydrates you should have at each meal and for snack. Foods that contain carbohydrates include:  Bread, cereal, rice, pasta, and crackers.  Potatoes and corn.  Peas, beans, and lentils.  Milk and yogurt.  Fruit and juice.  Desserts, such as cakes, cookies, ice cream, and candy.  How does alcohol affect me? Alcohol can cause a sudden decrease in blood  glucose (hypoglycemia), especially if you use insulin or take certain oral diabetes medicines. Hypoglycemia can be a life-threatening condition. Symptoms of hypoglycemia (sleepiness, dizziness, and confusion) are similar to symptoms of having too much alcohol. If your health care provider says that alcohol is safe for you, follow these guidelines:  Limit alcohol intake to no more than 1 drink per day for nonpregnant women and 2 drinks per day for men. One drink equals 12 oz of beer, 5 oz of wine, or 1 oz of hard liquor.  Do not drink on an empty stomach.  Keep yourself hydrated with water, diet soda, or unsweetened iced tea.  Keep in mind that regular soda, juice, and other mixers may contain a lot of sugar and must be counted as carbohydrates.  What are tips for following this plan? Reading food labels  Start by checking the serving size on the label. The amount of calories, carbohydrates, fats, and other nutrients listed on the label are based on one serving of the food. Many foods contain more than one serving per package.  Check the total grams (g) of carbohydrates in one serving. You can calculate the number of servings of carbohydrates in one serving by dividing the total carbohydrates by 15. For example, if a food has 30 g of total carbohydrates, it would be equal to 2 servings of carbohydrates.  Check the number of grams (g) of saturated and trans fats in one serving. Choose foods that have low or no amount of these fats.  Check the number of milligrams (mg) of sodium in one serving. Most people   should limit total sodium intake to less than 2,300 mg per day.  Always check the nutrition information of foods labeled as "low-fat" or "nonfat". These foods may be higher in added sugar or refined carbohydrates and should be avoided.  Talk to your dietitian to identify your daily goals for nutrients listed on the label. Shopping  Avoid buying canned, premade, or processed foods. These  foods tend to be high in fat, sodium, and added sugar.  Shop around the outside edge of the grocery store. This includes fresh fruits and vegetables, bulk grains, fresh meats, and fresh dairy. Cooking  Use low-heat cooking methods, such as baking, instead of high-heat cooking methods like deep frying.  Cook using healthy oils, such as olive, canola, or sunflower oil.  Avoid cooking with butter, cream, or high-fat meats. Meal planning  Eat meals and snacks regularly, preferably at the same times every day. Avoid going long periods of time without eating.  Eat foods high in fiber, such as fresh fruits, vegetables, beans, and whole grains. Talk to your dietitian about how many servings of carbohydrates you can eat at each meal.  Eat 4-6 ounces of lean protein each day, such as lean meat, chicken, fish, eggs, or tofu. 1 ounce is equal to 1 ounce of meat, chicken, or fish, 1 egg, or 1/4 cup of tofu.  Eat some foods each day that contain healthy fats, such as avocado, nuts, seeds, and fish. Lifestyle   Check your blood glucose regularly.  Exercise at least 30 minutes 5 or more days each week, or as told by your health care provider.  Take medicines as told by your health care provider.  Do not use any products that contain nicotine or tobacco, such as cigarettes and e-cigarettes. If you need help quitting, ask your health care provider.  Work with a counselor or diabetes educator to identify strategies to manage stress and any emotional and social challenges. What are some questions to ask my health care provider?  Do I need to meet with a diabetes educator?  Do I need to meet with a dietitian?  What number can I call if I have questions?  When are the best times to check my blood glucose? Where to find more information:  American Diabetes Association: diabetes.org/food-and-fitness/food  Academy of Nutrition and Dietetics:  www.eatright.org/resources/health/diseases-and-conditions/diabetes  National Institute of Diabetes and Digestive and Kidney Diseases (NIH): www.niddk.nih.gov/health-information/diabetes/overview/diet-eating-physical-activity Summary  A healthy meal plan will help you control your blood glucose and maintain a healthy lifestyle.  Working with a diet and nutrition specialist (dietitian) can help you make a meal plan that is best for you.  Keep in mind that carbohydrates and alcohol have immediate effects on your blood glucose levels. It is important to count carbohydrates and to use alcohol carefully. This information is not intended to replace advice given to you by your health care provider. Make sure you discuss any questions you have with your health care provider. Document Released: 02/24/2005 Document Revised: 07/04/2016 Document Reviewed: 07/04/2016 Elsevier Interactive Patient Education  2018 [employer] Inc.  

## 2018-02-07 ENCOUNTER — Telehealth (INDEPENDENT_AMBULATORY_CARE_PROVIDER_SITE_OTHER): Payer: Self-pay

## 2018-02-07 LAB — MICROALBUMIN / CREATININE URINE RATIO
CREATININE, UR: 140.1 mg/dL
MICROALBUM., U, RANDOM: 6.1 ug/mL
Microalb/Creat Ratio: 4.4 mg/g creat (ref 0.0–30.0)

## 2018-02-07 NOTE — Telephone Encounter (Signed)
Patient is aware of normal urinary proteins. Jordan Bryant S Nonie Lochner, CMA  

## 2018-02-07 NOTE — Telephone Encounter (Signed)
-----   Message from Loletta Specteroger David Gomez, PA-C sent at 02/07/2018  1:40 PM EDT ----- Normal urinary proteins.

## 2018-02-19 ENCOUNTER — Ambulatory Visit (INDEPENDENT_AMBULATORY_CARE_PROVIDER_SITE_OTHER): Payer: Self-pay | Admitting: Physician Assistant

## 2018-02-20 ENCOUNTER — Other Ambulatory Visit (INDEPENDENT_AMBULATORY_CARE_PROVIDER_SITE_OTHER): Payer: Self-pay

## 2018-02-20 DIAGNOSIS — Z794 Long term (current) use of insulin: Secondary | ICD-10-CM

## 2018-02-20 DIAGNOSIS — E119 Type 2 diabetes mellitus without complications: Secondary | ICD-10-CM

## 2018-02-20 DIAGNOSIS — E7841 Elevated Lipoprotein(a): Secondary | ICD-10-CM

## 2018-02-20 NOTE — Progress Notes (Signed)
Labs collected by onsite [facility] phlebotomist. Jordan Bryant

## 2018-02-21 ENCOUNTER — Other Ambulatory Visit (INDEPENDENT_AMBULATORY_CARE_PROVIDER_SITE_OTHER): Payer: Self-pay | Admitting: Physician Assistant

## 2018-02-21 LAB — LIPID PANEL
CHOL/HDL RATIO: 3.4 ratio (ref 0.0–5.0)
Cholesterol, Total: 189 mg/dL (ref 100–199)
HDL: 56 mg/dL (ref >39)
LDL Calculated: 105 mg/dL — ABNORMAL HIGH (ref 0–99)
Triglycerides: 140 mg/dL (ref 0–149)
VLDL Cholesterol Cal: 28 mg/dL (ref 5–40)

## 2018-02-21 LAB — COMPREHENSIVE METABOLIC PANEL
ALT: 10 IU/L (ref 0–44)
AST: 15 IU/L (ref 0–40)
Albumin/Globulin Ratio: 1.7 (ref 1.2–2.2)
Albumin: 4.5 g/dL (ref 3.6–4.8)
Alkaline Phosphatase: 48 IU/L (ref 39–117)
BUN/Creatinine Ratio: 15 (ref 10–24)
BUN: 15 mg/dL (ref 8–27)
Bilirubin Total: 0.4 mg/dL (ref 0.0–1.2)
CALCIUM: 8.7 mg/dL (ref 8.6–10.2)
CO2: 18 mmol/L — AB (ref 20–29)
Chloride: 101 mmol/L (ref 96–106)
Creatinine, Ser: 1.02 mg/dL (ref 0.76–1.27)
GFR calc Af Amer: 90 mL/min/{1.73_m2} (ref >59)
GFR, EST NON AFRICAN AMERICAN: 78 mL/min/{1.73_m2} (ref >59)
GLUCOSE: 179 mg/dL — AB (ref 65–99)
Globulin, Total: 2.7 g/dL (ref 1.5–4.5)
Potassium: 4.6 mmol/L (ref 3.5–5.2)
Sodium: 141 mmol/L (ref 134–144)
Total Protein: 7.2 g/dL (ref 6.0–8.5)

## 2018-02-21 MED ORDER — LOVASTATIN 40 MG PO TABS: 40.0000 mg | ORAL_TABLET | Freq: Every day | ORAL | 3 refills | Status: DC

## 2018-02-22 ENCOUNTER — Telehealth (INDEPENDENT_AMBULATORY_CARE_PROVIDER_SITE_OTHER): Payer: Self-pay

## 2018-02-22 MED FILL — LOVASTATIN 40 MG TABS: 40 | 30 days supply | Qty: 30 | Fill #0

## 2018-02-22 NOTE — Telephone Encounter (Signed)
-----   Message from Loletta Specteroger David Gomez, PA-C sent at 02/21/2018  6:17 PM EDT ----- Cholesterol elevated. I have increased Lovastatin to 40 mg in an effort to have LDL below 70. His LDL is at 105. I have sent lovastatin to [facility].

## 2018-02-22 NOTE — Telephone Encounter (Signed)
Patient is aware that cholesterol is elevated. Lovastatin increased to 40mg  in an effort to reduce LDL to below 70, which is currently at 105. Lovastatin has been sent to [facility]. Maryjean Mornempestt S Mak Bonny, CMA

## 2018-03-08 ENCOUNTER — Other Ambulatory Visit (INDEPENDENT_AMBULATORY_CARE_PROVIDER_SITE_OTHER): Payer: Self-pay | Admitting: Physician Assistant

## 2018-03-08 DIAGNOSIS — L309 Dermatitis, unspecified: Secondary | ICD-10-CM

## 2018-03-08 MED FILL — TRUEplus LANCETS 28G MISC: 30 days supply | Qty: 100 | Fill #5

## 2018-03-08 MED FILL — !FARXIGA 10MG TABLET: 10 | 30 days supply | Qty: 30 | Fill #1

## 2018-03-08 MED FILL — ?GLIPIZIDE 5MG TABLET: 5 | 30 days supply | Qty: 60 | Fill #1

## 2018-03-08 MED FILL — ?METFORMIN HCL 1000MG TABS: 1000 | 30 days supply | Qty: 60 | Fill #1

## 2018-03-08 MED FILL — TRUE METRIX TEST STRIP: 30 days supply | Qty: 100 | Fill #5

## 2018-03-08 MED FILL — ?METOPROLOL TARTRATE 25 MG: 25 | 30 days supply | Qty: 30 | Fill #1

## 2018-03-08 NOTE — Telephone Encounter (Signed)
FWD to PCP. Alezander Dimaano S Mailee Klaas, CMA  

## 2018-03-09 MED FILL — FLUOCINONIDE 0.05% OINTMENT: 0.05 | 15 days supply | Qty: 30 | Fill #0

## 2018-03-13 ENCOUNTER — Other Ambulatory Visit: Payer: Self-pay

## 2018-03-13 DIAGNOSIS — K5900 Constipation, unspecified: Secondary | ICD-10-CM

## 2018-03-13 MED ORDER — LINACLOTIDE 72 MCG PO CAPS: 72.0000 ug | ORAL_CAPSULE | Freq: Every day | ORAL | 3 refills | Status: DC

## 2018-03-14 MED FILL — $LINZESS 72 MCG CAPSULE: 72 | 90 days supply | Qty: 90 | Fill #0

## 2018-03-19 ENCOUNTER — Ambulatory Visit (INDEPENDENT_AMBULATORY_CARE_PROVIDER_SITE_OTHER): Payer: Self-pay | Admitting: Physician Assistant

## 2018-03-19 ENCOUNTER — Encounter (INDEPENDENT_AMBULATORY_CARE_PROVIDER_SITE_OTHER): Payer: Self-pay | Admitting: Physician Assistant

## 2018-03-19 VITALS — BP 132/75 | HR 67 | Temp 97.6°F | Wt 171.6 lb

## 2018-03-19 DIAGNOSIS — M25562 Pain in left knee: Secondary | ICD-10-CM

## 2018-03-19 DIAGNOSIS — E119 Type 2 diabetes mellitus without complications: Secondary | ICD-10-CM

## 2018-03-19 DIAGNOSIS — G8929 Other chronic pain: Secondary | ICD-10-CM

## 2018-03-19 DIAGNOSIS — M25561 Pain in right knee: Secondary | ICD-10-CM

## 2018-03-19 MED ORDER — NAPROXEN 500 MG PO TABS: 500.0000 mg | ORAL_TABLET | Freq: Two times a day (BID) | ORAL | 0 refills | Status: DC

## 2018-03-19 MED FILL — NAPROXEN 500 MG TABLET: 500 | 15 days supply | Qty: 30 | Fill #0

## 2018-03-19 MED FILL — LOVASTATIN 40 MG TABS: 40 | 30 days supply | Qty: 30 | Fill #1

## 2018-03-19 NOTE — Progress Notes (Signed)
Subjective:  Patient ID: Jordan Bryant, male    DOB: 02-01-1956  Age: 62 y.o. MRN: [id number]  CC: knee pain  HPI Jordan Bryant a 62 y.o.malewith a medical history of CAD, DM2, HLD, and strokepresents with chronic knee bilateral knee pain waxing and waning over a four year period. Lastest episode is now ten days long. Pain is focal on the medial aspect of the left upper patella and described as very sharp. No associated weakness, swelling, redness, injury, giving way, or locking. Says he has full aROM of the bilateral knee but is not able to squat due to the pressure his body weight exerts on his knees. Not currently taking any medication for relief of pain. Would like a referral to physical therapy.     Pt has stopped taking glipizide and Wilder Glade because his son suggested he take [place]. Believes [place] is working and reports glucometer readings ranging from approximately 100 to 195. Would like to try for three months to see how his A1c is affected.      Outpatient Medications Prior to Visit  Medication Sig Dispense Refill  . aspirin 81 MG chewable tablet Chew by mouth daily.    . Blood Glucose Monitoring Suppl (TRUE METRIX AIR GLUCOSE METER) w/Device KIT 1 each by Does not apply route as directed. 1 kit 0  . dapagliflozin propanediol (FARXIGA) 10 MG TABS tablet Take 10 mg by mouth daily. 30 tablet 5  . Exenatide ER 2 MG/0.85ML AUIJ Inject 2 mg into the skin once a week. 4 pen 11  . fluocinonide ointment (LIDEX) 3.79 % APPLY 1 APPLICATION TOPICALLY 2 (TWO) TIMES DAILY. 30 g 0  . gabapentin (NEURONTIN) 100 MG capsule Take 1 capsule (100 mg total) by mouth 3 (three) times daily. 90 capsule 1  . glipiZIDE (GLUCOTROL) 5 MG tablet TAKE 1 TABLET (5 MG TOTAL) BY MOUTH 2 (TWO) TIMES DAILY BEFORE A MEAL. 180 tablet 3  . glucose blood test strip Use as instructed 100 each 12  . Lancets MISC 1 each by Does not apply route 3 (three) times daily. 90 each 11  . linaclotide  (LINZESS) 72 MCG capsule Take 1 capsule (72 mcg total) by mouth daily before breakfast. 90 capsule 3  . lovastatin (MEVACOR) 40 MG tablet Take 1 tablet (40 mg total) by mouth at bedtime. 90 tablet 3  . metFORMIN (GLUCOPHAGE) 1000 MG tablet TAKE 1 TABLET (1,000 MG TOTAL) BY MOUTH 2 (TWO) TIMES DAILY WITH A MEAL. 180 tablet 3  . metoprolol tartrate (LOPRESSOR) 25 MG tablet Take 0.5 tablets (12.5 mg total) by mouth 2 (two) times daily. 90 tablet 3  . niacin 100 MG tablet Take 1 tablet (100 mg total) by mouth at bedtime. (Patient not taking: Reported on 02/06/2018) 30 tablet 5   Facility-Administered Medications Prior to Visit  Medication Dose Route Frequency Provider Last Rate Last Dose  . 0.9 %  sodium chloride infusion  500 mL Intravenous Once Nelida Meuse III, MD         ROS Review of Systems  Constitutional: Negative for chills, fever and malaise/fatigue.  Eyes: Negative for blurred vision.  Respiratory: Negative for shortness of breath.   Cardiovascular: Negative for chest pain and palpitations.  Gastrointestinal: Negative for abdominal pain and nausea.  Genitourinary: Negative for dysuria and hematuria.  Musculoskeletal: Positive for joint pain. Negative for myalgias.  Skin: Negative for rash.  Neurological: Negative for tingling and headaches.  Psychiatric/Behavioral: Negative for depression. The patient is not nervous/anxious.  Objective:  BP 132/75 (BP Location: Left Arm, Patient Position: Sitting, Cuff Size: Normal)   Pulse 67   Temp 97.6 F (36.4 C) (Oral)   Wt 171 lb 9.6 oz (77.8 kg)   SpO2 97%   BMI 26.09 kg/m   BP/Weight 03/19/2018 02/06/2018 0/98/1191  Systolic BP 478 295 621  Diastolic BP 75 78 78  Wt. (Lbs) 171.6 169.6 174.2  BMI 26.09 25.79 26.49      Physical Exam  Constitutional: He is oriented to person, place, and time.  Well developed, well nourished, NAD, polite  HENT:  Head: Normocephalic and atraumatic.  Eyes: No scleral icterus.  Neck:  Normal range of motion. Neck supple. No thyromegaly present.  Cardiovascular: Normal rate, regular rhythm and normal heart sounds.  Pulmonary/Chest: Effort normal and breath sounds normal.  Musculoskeletal: He exhibits no edema.  Focal TTP of the superomedial margin of the left patella. Mildly positive patellar grind bilaterally. Negative anterior/posterior drawer, negative varus/valgus stress testing. No midline joint tenderness bilaterally. Pt unable to squat due to pain of the knees at approximately 45 degrees of flexion.  Neurological: He is alert and oriented to person, place, and time.  Skin: Skin is warm and dry. No rash noted. No erythema. No pallor.  Psychiatric: He has a normal mood and affect. His behavior is normal. Thought content normal.  Vitals reviewed.    Assessment & Plan:   1. Chronic pain of both knees - DG Knee Complete 4 Views Right; Future - DG Knee Complete 4 Views Left; Future - Ambulatory referral to Physical Therapy - Begin naproxen (NAPROSYN) 500 MG tablet; Take 1 tablet (500 mg total) by mouth 2 (two) times daily with a meal.  Dispense: 30 tablet; Refill: 0  2. Type 2 diabetes mellitus without complication, without long-term current use of insulin (HCC) - Pt noncompliant with Glipizide and Farxiga. He is taking [place] and will be taking for the next three months to assess impact on his A1c. I have told him I do not know anything about [place] and if his self imposed trial fails, he should begin taking Glipizide and [place] again. Pt continues to take his Metformin.        Meds ordered this encounter  Medications  . naproxen (NAPROSYN) 500 MG tablet    Sig: Take 1 tablet (500 mg total) by mouth 2 (two) times daily with a meal.    Dispense:  30 tablet    Refill:  0    Order Specific Question:   Supervising Provider    Answer:   Charlott Rakes [[id number]]    Follow-up: Return in about 3 months (around 06/19/2018), or if symptoms worsen or  fail to improve, for DM2.   Clent Demark PA

## 2018-03-19 NOTE — Patient Instructions (Signed)
Please consider using Osteo Bi Flex  Knee Pain, Adult Many things can cause knee pain. The pain often goes away on its own with time and rest. If the pain does not go away, tests may be done to find out what is causing the pain. Follow these instructions at home: Activity  Rest your knee.  Do not do things that cause pain.  Avoid activities where both feet leave the ground at the same time (high-impact activities). Examples are running, jumping rope, and doing jumping jacks. General instructions  Take medicines only as told by your doctor.  Raise (elevate) your knee when you are resting. Make sure your knee is higher than your heart.  Sleep with a pillow under your knee.  If told, put ice on the knee: ? Put ice in a plastic bag. ? Place a towel between your skin and the bag. ? Leave the ice on for 20 minutes, 2-3 times a day.  Ask your doctor if you should wear an elastic knee support.  Lose weight if you are overweight. Being overweight can make your knee hurt more.  Do not use any tobacco products. These include cigarettes, chewing tobacco, or electronic cigarettes. If you need help quitting, ask your doctor. Smoking may slow down healing. Contact a doctor if:  The pain does not stop.  The pain changes or gets worse.  You have a fever along with knee pain.  Your knee gives out or locks up.  Your knee swells, and becomes worse. Get help right away if:  Your knee feels warm.  You cannot move your knee.  You have very bad knee pain.  You have chest pain.  You have trouble breathing. Summary  Many things can cause knee pain. The pain often goes away on its own with time and rest.  Avoid activities that put stress on your knee. These include running and jumping rope.  Get help right away if you cannot move your knee, or if your knee feels warm, or if you have trouble breathing. This information is not intended to replace advice given to you by your health care  provider. Make sure you discuss any questions you have with your health care provider. Document Released: 08/26/2008 Document Revised: 05/24/2016 Document Reviewed: 05/24/2016 Elsevier Interactive Patient Education  2017 [employer].

## 2018-04-04 MED FILL — ?METFORMIN HCL 1000MG TABS: 1000 | 30 days supply | Qty: 60 | Fill #2

## 2018-04-04 MED FILL — METOPROLOL TARTRATE 25 MG T: 25 | 30 days supply | Qty: 30 | Fill #2

## 2018-04-04 MED FILL — TRUE METRIX TEST STRIP: 30 days supply | Qty: 100 | Fill #6

## 2018-04-04 MED FILL — FARXIGA 10 MG TABLET: 10 | 30 days supply | Qty: 30 | Fill #2

## 2018-04-04 MED FILL — TRUEplus LANCETS 28G MISC: 30 days supply | Qty: 100 | Fill #6

## 2018-04-04 MED FILL — LOVASTATIN 10 MG TABLET: 10 | 30 days supply | Qty: 30 | Fill #1

## 2018-04-04 MED FILL — ?GLIPIZIDE 5MG TABLET: 5 | 30 days supply | Qty: 60 | Fill #2

## 2018-04-12 ENCOUNTER — Ambulatory Visit (HOSPITAL_COMMUNITY)
Admission: RE | Admit: 2018-04-12 | Discharge: 2018-04-12 | Disposition: A | Discharge status: Home / Self Care | Payer: Self-pay | Source: Ambulatory Visit | Attending: Physician Assistant | Admitting: Physician Assistant

## 2018-04-12 DIAGNOSIS — M25561 Pain in right knee: Secondary | ICD-10-CM | POA: Insufficient documentation

## 2018-04-12 DIAGNOSIS — M1712 Unilateral primary osteoarthritis, left knee: Secondary | ICD-10-CM | POA: Insufficient documentation

## 2018-04-12 DIAGNOSIS — M25562 Pain in left knee: Secondary | ICD-10-CM | POA: Insufficient documentation

## 2018-04-12 DIAGNOSIS — G8929 Other chronic pain: Secondary | ICD-10-CM | POA: Insufficient documentation

## 2018-04-13 ENCOUNTER — Telehealth (INDEPENDENT_AMBULATORY_CARE_PROVIDER_SITE_OTHER): Payer: Self-pay

## 2018-04-13 NOTE — Telephone Encounter (Signed)
Patient is aware that XR revealed minimal degenerative changes in the left knee, right knee normal. Take anti-inflammatory medications as needed according to label instructions. Patient is still waiting to hear from PT. Maryjean Morn, CMA

## 2018-04-13 NOTE — Telephone Encounter (Signed)
-----   Message from Loletta Specter, PA-C sent at 04/13/2018 11:00 AM EDT ----- Left knee with minimal degenerative changes. Right knee normal. Take anti-inflammatory medications as needed according to label instructions.

## 2018-04-16 MED FILL — LOVASTATIN 40 MG TABS: 40 | 30 days supply | Qty: 30 | Fill #2

## 2018-04-27 ENCOUNTER — Telehealth (INDEPENDENT_AMBULATORY_CARE_PROVIDER_SITE_OTHER): Payer: Self-pay | Admitting: Physician Assistant

## 2018-04-27 NOTE — Telephone Encounter (Signed)
Patient does have CAFA it is effective from 01/29/18- 08/01/2018. Jordan Bryant, CMA

## 2018-04-27 NOTE — Telephone Encounter (Signed)
Patient called requesting to see if PCP could change his referral to physical therapy due to a physical therapist contacting him stating that he does not have the [facility] discount. I spoke with patient and stated that we did not have his [facility] discount letter on file therefore physical therapy contacted him to state that he would be self pay. I will contact Jordan SpruceStephen Bryant at the main hospital to see if he can scan a letter for him to start his physical therapy.  Thank you Jordan SecondGloria I Vargas Bryant

## 2018-04-30 NOTE — Telephone Encounter (Signed)
Noted. Jordan Bryant  °

## 2018-05-07 MED FILL — ?METOPROLOL TARTRATE 25 MG: 25 | 30 days supply | Qty: 30 | Fill #3

## 2018-05-08 MED FILL — FARXIGA 10 MG TABLET: 10 | 30 days supply | Qty: 30 | Fill #3

## 2018-05-08 MED FILL — TRUEplus LANCETS 28G MISC: 30 days supply | Qty: 100 | Fill #7

## 2018-05-08 MED FILL — $BYDUERON 2MG PEN INJECT: 2 | 84 days supply | Qty: 12 | Fill #1

## 2018-05-08 MED FILL — ?METFORMIN HCL 1000MG TABS: 1000 | 30 days supply | Qty: 60 | Fill #3

## 2018-05-08 MED FILL — TRUE METRIX TEST STRIP: 30 days supply | Qty: 100 | Fill #7

## 2018-05-08 MED FILL — LOVASTATIN 10 MG TABLET: 10 | 30 days supply | Qty: 30 | Fill #2

## 2018-05-08 MED FILL — ?GLIPIZIDE 5MG TABLET: 5 | 30 days supply | Qty: 60 | Fill #3

## 2018-05-14 MED FILL — LOVASTATIN 40 MG TABS: 40 | 30 days supply | Qty: 30 | Fill #3

## 2018-05-22 ENCOUNTER — Other Ambulatory Visit: Payer: Self-pay

## 2018-05-22 ENCOUNTER — Encounter: Payer: Self-pay | Admitting: Physical Therapy

## 2018-05-22 ENCOUNTER — Ambulatory Visit: Payer: Self-pay | Attending: Physician Assistant | Admitting: Physical Therapy

## 2018-05-22 DIAGNOSIS — M25561 Pain in right knee: Secondary | ICD-10-CM | POA: Insufficient documentation

## 2018-05-22 DIAGNOSIS — R262 Difficulty in walking, not elsewhere classified: Secondary | ICD-10-CM | POA: Insufficient documentation

## 2018-05-22 DIAGNOSIS — M25562 Pain in left knee: Secondary | ICD-10-CM | POA: Insufficient documentation

## 2018-05-22 NOTE — Therapy (Signed)
[facility] [address] [place], [place], [address] Phone: [phone]   Fax:  [phone]  Physical Therapy Evaluation  Patient Details  Name: Jordan Bryant MRN: [id number] Date of Birth: 10/16/1955 Referring Provider (PT): Sindy Messingoger Gomez   Encounter Date: 05/22/2018  PT End of Session - 05/22/18 1556    Visit Number  1    Date for PT Re-Evaluation  07/23/18    PT Start Time  1530    PT Stop Time  1605    PT Time Calculation (min)  35 min    Activity Tolerance  Patient tolerated treatment well    Behavior During Therapy  [facility] for tasks assessed/performed       Past Medical History:  Diagnosis Date  . Asthma   . Coronary artery disease   . Diabetes mellitus without complication (HCC)   . Stroke [facility](HCC)     Past Surgical History:  Procedure Laterality Date  . Kidney Stones    . PROSTATE SURGERY      There were no vitals filed for this visit.   Subjective Assessment - 05/22/18 1533    Subjective  Patient reports that over the past 6 months he has had increased knee pain, he reports that he has had weakness for about a year.  X-rays show arthritis    Limitations  House hold activities;Lifting    Patient Stated Goals  have less pain, less difficulty getting up from sitting    Currently in Pain?  Yes    Pain Score  1     Pain Location  Knee    Pain Orientation  Right;Left    Pain Descriptors / Indicators  Aching;Sore;Sharp    Pain Type  Acute pain    Pain Onset  More than a month ago    Pain Frequency  Intermittent    Aggravating Factors   getting up from sitting pain is up to 6/10    Pain Relieving Factors  rest there are at times no pain    Effect of Pain on Daily Activities  difficulty getting up from sitting         [facility] - 05/22/18 0001      Assessment   Medical Diagnosis  bilateral knee pain    Referring Provider (PT)  Sindy Messingoger Gomez    Onset Date/Surgical Date  03/22/18    Prior  Therapy  for shoulder      Precautions   Precautions  None      Balance Screen   Has the patient fallen in the past 6 months  No    Has the patient had a decrease in activity level because of a fear of falling?   No    Is the patient reluctant to leave their home because of a fear of falling?   No      Home Environment   Additional Comments  no stairs      Prior Function   Level of Independence  Independent    Vocation  Retired    Leisure  no exercise      AROM   AROM Assessment Site  Knee    Right/Left Shoulder  Right;Left    Right/Left Knee  Right;Left    Right Knee Extension  0    Right Knee Flexion  110    Left Knee Extension  0    Left Knee Flexion  110      Strength   Overall  Strength Comments  3+/5 for the knees, could not give much resistance      Palpation   Palpation comment  some palapablke crepitus with knee extension , he is very tender in the left antero medial knee      Ambulation/Gait   Gait Comments  some difficulty getting up from sitting, gait is without deviation, he does have difficulty going up and down stairs due to pain and weakness                Objective measurements completed on examination: See above findings.              PT Education - 05/22/18 1556    Education provided  Yes    Education Details  QS and SAQ's    Person(s) Educated  Patient    Methods  Explanation;Demonstration;Tactile cues;Handout;Verbal cues    Comprehension  Verbalized understanding       PT Short Term Goals - 05/22/18 1613      PT SHORT TERM GOAL #1   Title  independent with initial HEP    Time  2    Period  Weeks    Status  New        PT Long Term Goals - 05/22/18 1629      PT LONG TERM GOAL #1   Title  decrease pain 25%    Time  8    Period  Weeks    Status  New      PT LONG TERM GOAL #2   Title  increase strength bilateral knees to 4/5    Time  8    Period  Weeks    Status  New      PT LONG TERM GOAL #3   Title  get up  from sitting without difficulty and withiout using hands    Time  8    Period  Weeks    Status  New      PT LONG TERM GOAL #4   Title  walk 2 miles without difficulty     Time  8    Period  Weeks    Status  New             Plan - 05/22/18 1610    Clinical Impression Statement  Patient reports some pain in the knees for about a year, reports that he has been having some difficulty with getting up from sitting and has pain with this.  He has some tightness in the LE mms, he is weak in the quads and HS, has some crepitus with extension.  Has pain with stairs    Clinical Presentation  Stable    Clinical Decision Making  Low    Rehab Potential  Good    PT Frequency  2x / week    PT Duration  8 weeks    PT Treatment/Interventions  Cryotherapy;Electrical Stimulation;Iontophoresis 4mg /ml Dexamethasone;Moist Heat;Therapeutic activities;Therapeutic exercise;Manual techniques;Patient/family education;Gait training;Stair training;Balance training;Neuromuscular re-education    PT Next Visit Plan  start strengthening of the LE's    Consulted and Agree with Plan of Care  Patient       Patient will benefit from skilled therapeutic intervention in order to improve the following deficits and impairments:  Decreased range of motion, Pain, Decreased strength, Abnormal gait, Difficulty walking, Increased muscle spasms, Decreased activity tolerance, Impaired flexibility  Visit Diagnosis: Acute pain of right knee - Plan: PT plan of care cert/re-cert  Acute pain of left knee - Plan: PT plan  of care cert/re-cert  Difficulty in walking, not elsewhere classified - Plan: PT plan of care cert/re-cert     Problem List Patient Active Problem List   Diagnosis Date Noted  . Type 2 diabetes mellitus without complication, with long-term current use of insulin (HCC) 11/22/2016    Jearld Lesch., PT 05/22/2018, 4:32 PM  [facility] [address] [place], [place], [address] Phone: [phone]   Fax:  [phone]  Name: Jordan Bryant MRN: [id number] Date of Birth: 1955-11-10

## 2018-05-23 ENCOUNTER — Encounter (INDEPENDENT_AMBULATORY_CARE_PROVIDER_SITE_OTHER): Payer: Self-pay | Admitting: Physician Assistant

## 2018-05-23 ENCOUNTER — Ambulatory Visit (INDEPENDENT_AMBULATORY_CARE_PROVIDER_SITE_OTHER): Payer: Self-pay | Admitting: Physician Assistant

## 2018-05-23 VITALS — BP 125/70 | HR 81 | Temp 97.9°F | Ht 68.0 in | Wt 169.2 lb

## 2018-05-23 DIAGNOSIS — H6122 Impacted cerumen, left ear: Secondary | ICD-10-CM

## 2018-05-23 DIAGNOSIS — Z794 Long term (current) use of insulin: Secondary | ICD-10-CM

## 2018-05-23 DIAGNOSIS — I444 Left anterior fascicular block: Secondary | ICD-10-CM

## 2018-05-23 DIAGNOSIS — R000 Tachycardia, unspecified: Secondary | ICD-10-CM

## 2018-05-23 DIAGNOSIS — E119 Type 2 diabetes mellitus without complications: Secondary | ICD-10-CM

## 2018-05-23 LAB — POCT GLYCOSYLATED HEMOGLOBIN (HGB A1C): HEMOGLOBIN A1C: 6.7 % — AB (ref 4.0–5.6)

## 2018-05-23 MED ORDER — CARBAMIDE PEROXIDE 6.5 % OT SOLN: 5.0000 [drp] | Freq: Two times a day (BID) | OTIC | 0 refills | Status: DC

## 2018-05-23 NOTE — Progress Notes (Signed)
Subjective:  Patient ID: Jordan Bryant, male    DOB: [birth year]  Age: 62 y.o. MRN: [id number]  CC: otitis media  HPI Mostafa Spallis a 62 y.o.malewith a medical history of CAD, DM2,HLD,and strokepresents with left ear discomfort. Says he can hear a strange sound when he pushes on his tragus. Also has trouble understanding what people are saying and has to follow their lips to better understand them. Thinks he may have to be referred for a hearing aid.     Last A1c 7.4% three and a half months ago. Wilder Glade was added to Exenatide ER, glipizide 38m BID, and metformin 1000 mg. However, pt stopped taking glipizide and Farxiga in favor of taking [place] Says indian barberry became ineffective after a few weeks and began to take [place] Glipizide again. Blood sugars began to decrease again and fell to the 70s - 80s range. Daughter, whom is a doctor advised for him to stop Glipizide. A1c is now 6.7% today. Reports glucometer readings in the low 100s.    Lastly, pt had a couple of episode of tachycardia at rest. Says his heart rate is usually in the 80s but found his heart rate to be in the 90s once and a little over a 100 at another occasion. Says daughter told him to get an EKG.     Outpatient Medications Prior to Visit  Medication Sig Dispense Refill  . aspirin 81 MG chewable tablet Chew by mouth daily.    . Blood Glucose Monitoring Suppl (TRUE METRIX AIR GLUCOSE METER) w/Device KIT 1 each by Does not apply route as directed. 1 kit 0  . dapagliflozin propanediol (FARXIGA) 10 MG TABS tablet Take 10 mg by mouth daily. 30 tablet 5  . Exenatide ER 2 MG/0.85ML AUIJ Inject 2 mg into the skin once a week. 4 pen 11  . fluocinonide ointment (LIDEX) 04.00% APPLY 1 APPLICATION TOPICALLY 2 (TWO) TIMES DAILY. 30 g 0  . gabapentin (NEURONTIN) 100 MG capsule Take 1 capsule (100 mg total) by mouth 3 (three) times daily. 90 capsule 1  . glipiZIDE (GLUCOTROL) 5 MG tablet TAKE 1 TABLET (5 MG TOTAL)  BY MOUTH 2 (TWO) TIMES DAILY BEFORE A MEAL. 180 tablet 3  . glucose blood test strip Use as instructed 100 each 12  . Lancets MISC 1 each by Does not apply route 3 (three) times daily. 90 each 11  . linaclotide (LINZESS) 72 MCG capsule Take 1 capsule (72 mcg total) by mouth daily before breakfast. 90 capsule 3  . lovastatin (MEVACOR) 40 MG tablet Take 1 tablet (40 mg total) by mouth at bedtime. 90 tablet 3  . metFORMIN (GLUCOPHAGE) 1000 MG tablet TAKE 1 TABLET (1,000 MG TOTAL) BY MOUTH 2 (TWO) TIMES DAILY WITH A MEAL. 180 tablet 3  . metoprolol tartrate (LOPRESSOR) 25 MG tablet Take 0.5 tablets (12.5 mg total) by mouth 2 (two) times daily. 90 tablet 3  . naproxen (NAPROSYN) 500 MG tablet Take 1 tablet (500 mg total) by mouth 2 (two) times daily with a meal. 30 tablet 0  . niacin 100 MG tablet Take 1 tablet (100 mg total) by mouth at bedtime. (Patient not taking: Reported on 02/06/2018) 30 tablet 5   Facility-Administered Medications Prior to Visit  Medication Dose Route Frequency Provider Last Rate Last Dose  . 0.9 %  sodium chloride infusion  500 mL Intravenous Once DDoran Stabler MD         ROS Review of Systems  Constitutional:  Negative for chills, fever and malaise/fatigue.  Eyes: Negative for blurred vision.  Respiratory: Negative for shortness of breath.   Cardiovascular: Negative for chest pain and palpitations.  Gastrointestinal: Negative for abdominal pain and nausea.  Genitourinary: Negative for dysuria and hematuria.  Musculoskeletal: Negative for joint pain and myalgias.  Skin: Negative for rash.  Neurological: Negative for tingling and headaches.       Strange sound from left ear  Psychiatric/Behavioral: Negative for depression. The patient is not nervous/anxious.     Objective:  BP 125/70 (BP Location: Left Arm, Patient Position: Sitting, Cuff Size: Normal)   Pulse 81   Temp 97.9 F (36.6 C) (Oral)   Ht 5' 8"  (1.727 m)   Wt 169 lb 3.2 oz (76.7 kg)   SpO2 94%    BMI 25.73 kg/m   BP/Weight 05/23/2018 03/19/2018 5/63/8756  Systolic BP 433 295 188  Diastolic BP 70 75 78  Wt. (Lbs) 169.2 171.6 169.6  BMI 25.73 26.09 25.79      Physical Exam  Constitutional: He is oriented to person, place, and time.  Well developed, well nourished, NAD, polite  HENT:  Head: Normocephalic and atraumatic.  Cerumen impaction of left ear.  Eyes: No scleral icterus.  Neck: Normal range of motion. Neck supple. No thyromegaly present.  Cardiovascular: Normal rate, regular rhythm and normal heart sounds. Exam reveals no gallop and no friction rub.  No murmur heard. Pulmonary/Chest: Effort normal and breath sounds normal.  Musculoskeletal: He exhibits no edema.  Neurological: He is alert and oriented to person, place, and time.  Skin: Skin is warm and dry. No rash noted. No erythema. No pallor.  Psychiatric: He has a normal mood and affect. His behavior is normal. Thought content normal.  Vitals reviewed.    Assessment & Plan:    1. Type 2 diabetes mellitus without complication, with long-term current use of insulin (HCC) - HgB A1c 6.7% - Lipid panel; Future  2. Tachycardia - Self reported, none seen in clinic. - EKG 12-Lead revealed left axis deviation - TSH; Future - Basic Metabolic Panel; Future - Ambulatory referral to Cardiology   3. Left anterior fascicular block - Ambulatory referral to Cardiology  4. Impacted cerumen of left ear - Begin carbamide peroxide (DEBROX) 6.5 % OTIC solution; Place 5 drops into the left ear 2 (two) times daily.  Dispense: 15 mL; Refill: 0 - Some cerumen removed with curette but could not remove all due to amount and proximity to TM.  Meds ordered this encounter  Medications  . carbamide peroxide (DEBROX) 6.5 % OTIC solution    Sig: Place 5 drops into the left ear 2 (two) times daily.    Dispense:  15 mL    Refill:  0    Order Specific Question:   Supervising Provider    Answer:   Charlott Rakes [[id number]]     Follow-up: Return in about 3 months (around 08/22/2018) for after cardiology referral.   Clent Demark PA

## 2018-05-23 NOTE — Patient Instructions (Addendum)
Please proceed to ED/call ambulance if you develop chest pain or pressure.   Sinus Tachycardia Sinus tachycardia is a kind of fast heartbeat. In sinus tachycardia, the heart beats more than 100 times a minute. Sinus tachycardia starts in a part of the heart called the sinus node. Sinus tachycardia may be harmless, or it may be a sign of a serious condition. What are the causes? This condition may be caused by:  Exercise or exertion.  A fever.  Pain.  Loss of body fluids (dehydration).  Severe bleeding (hemorrhage).  Anxiety and stress.  Certain substances, including: ? Alcohol. ? Caffeine. ? Tobacco and nicotine products. ? Diet pills. ? Illegal drugs.  Medical conditions including: ? Heart disease. ? An infection. ? An overactive thyroid (hyperthyroidism). ? A lack of red blood cells (anemia).  What are the signs or symptoms? Symptoms of this condition include:  A feeling that the heart is beating quickly (palpitations).  Suddenly noticing your heartbeat (cardiac awareness).  Dizziness.  Tiredness (fatigue).  Shortness of breath.  Chest pain.  Nausea.  Fainting.  How is this diagnosed? This condition is diagnosed with:  A physical exam.  Other tests, such as: ? Blood tests. ? An electrocardiogram (ECG). This test measures the electrical activity of the heart. ? Holter monitoring. For this test, you wear a device that records your heartbeat for one or more days.  You may be referred to a heart specialist (cardiologist). How is this treated? Treatment for this condition depends on the cause or underlying condition. Treatment may involve:  Treating the underlying condition.  Taking new medicines or changing your current medicines as told by your health care provider.  Making changes to your diet or lifestyle.  Practicing relaxation methods.  Follow these instructions at home: Lifestyle  Do not use any products that contain nicotine or tobacco,  such as cigarettes and e-cigarettes. If you need help quitting, ask your health care provider.  Learn relaxation methods, like deep breathing, to help you when you get stressed or anxious.  Do not use illegal drugs, such as cocaine.  Do not abuse alcohol. Limit alcohol intake to no more than 1 drink a day for non-pregnant women and 2 drinks a day for men. One drink equals 12 oz of beer, 5 oz of wine, or 1 oz of hard liquor.  Find time to rest and relax often. This reduces stress.  Avoid: ? Caffeine. ? Stimulants such as over-the-counter diet pills or pills that help you to stay awake. ? Situations that cause anxiety or stress. General instructions  Drink enough fluids to keep your urine clear or pale yellow.  Take over-the-counter and prescription medicines only as told by your health care provider.  Keep all follow-up visits as told by your health care provider. This is important. Contact a health care provider if:  You have a fever.  You have vomiting or diarrhea that keeps happening (is persistent). Get help right away if:  You have pain in your chest, upper arms, jaw, or neck.  You become weak or dizzy.  You feel faint.  You have palpitations that do not go away. This information is not intended to replace advice given to you by your health care provider. Make sure you discuss any questions you have with your health care provider. Document Released: 07/07/2004 Document Revised: 12/26/2015 Document Reviewed: 12/12/2014 Elsevier Interactive Patient Education  [employer]2018 Elsevier Inc.

## 2018-05-24 ENCOUNTER — Other Ambulatory Visit (INDEPENDENT_AMBULATORY_CARE_PROVIDER_SITE_OTHER): Payer: Self-pay

## 2018-05-24 DIAGNOSIS — E119 Type 2 diabetes mellitus without complications: Secondary | ICD-10-CM

## 2018-05-24 DIAGNOSIS — Z794 Long term (current) use of insulin: Secondary | ICD-10-CM

## 2018-05-24 DIAGNOSIS — R000 Tachycardia, unspecified: Secondary | ICD-10-CM

## 2018-05-24 NOTE — Progress Notes (Signed)
Labs drawn by onsite [facility] phlebotomist. Tempestt S Roberts, CMA  

## 2018-05-25 ENCOUNTER — Telehealth (INDEPENDENT_AMBULATORY_CARE_PROVIDER_SITE_OTHER): Payer: Self-pay

## 2018-05-25 LAB — BASIC METABOLIC PANEL
BUN/Creatinine Ratio: 16 (ref 10–24)
BUN: 16 mg/dL (ref 8–27)
CALCIUM: 8.9 mg/dL (ref 8.6–10.2)
CHLORIDE: 103 mmol/L (ref 96–106)
CO2: 21 mmol/L (ref 20–29)
Creatinine, Ser: 0.97 mg/dL (ref 0.76–1.27)
GFR calc Af Amer: 96 mL/min/{1.73_m2} (ref >59)
GFR, EST NON AFRICAN AMERICAN: 83 mL/min/{1.73_m2} (ref >59)
GLUCOSE: 108 mg/dL — AB (ref 65–99)
POTASSIUM: 4.2 mmol/L (ref 3.5–5.2)
Sodium: 138 mmol/L (ref 134–144)

## 2018-05-25 LAB — LIPID PANEL
CHOLESTEROL TOTAL: 124 mg/dL (ref 100–199)
Chol/HDL Ratio: 2.6 ratio (ref 0.0–5.0)
HDL: 47 mg/dL (ref >39)
LDL Calculated: 57 mg/dL (ref 0–99)
TRIGLYCERIDES: 102 mg/dL (ref 0–149)
VLDL Cholesterol Cal: 20 mg/dL (ref 5–40)

## 2018-05-25 LAB — TSH: TSH: 1.3 u[IU]/mL (ref 0.450–4.500)

## 2018-05-25 NOTE — Telephone Encounter (Signed)
Patient aware that labs are normal. Jordan Bryant, CMA

## 2018-05-25 NOTE — Telephone Encounter (Signed)
-----   Message from Loletta Specteroger David Gomez, PA-C sent at 05/25/2018  8:27 AM EST ----- Labs are normal.

## 2018-05-28 ENCOUNTER — Ambulatory Visit: Payer: Self-pay | Admitting: Physical Therapy

## 2018-05-28 MED FILL — TRUEplus LANCETS 28G MISC: 30 days supply | Qty: 100 | Fill #8

## 2018-05-28 MED FILL — ?GLIPIZIDE 5MG TABLET: 5 | 30 days supply | Qty: 60 | Fill #4

## 2018-05-28 MED FILL — TRUE METRIX TEST STRIP: 30 days supply | Qty: 100 | Fill #8

## 2018-05-28 MED FILL — $FARXIGA 10 MG TABLET: 10 | 30 days supply | Qty: 30 | Fill #4

## 2018-05-28 MED FILL — ?METOPROLOL TARTRATE 25 MG: 25 | 30 days supply | Qty: 30 | Fill #4

## 2018-05-28 MED FILL — ?METFORMIN HCL 1,000 MG TAB: 1000 | 30 days supply | Qty: 60 | Fill #4

## 2018-05-28 MED FILL — LOVASTATIN 40 MG TABS: 40 | 30 days supply | Qty: 30 | Fill #4

## 2018-05-28 MED FILL — !LINZESS 72 MCG CAPSULE: 72 MCG | 30 days supply | Qty: 30 | Fill #1

## 2018-06-25 ENCOUNTER — Ambulatory Visit (INDEPENDENT_AMBULATORY_CARE_PROVIDER_SITE_OTHER): Payer: Self-pay | Admitting: Internal Medicine

## 2018-06-29 MED FILL — ?METFORMIN HCL 1000MG TABS: 1000 | 30 days supply | Qty: 60 | Fill #5

## 2018-06-29 MED FILL — TRUE METRIX TEST STRIP: 30 days supply | Qty: 100 | Fill #9

## 2018-06-29 MED FILL — LOVASTATIN 40 MG TABS: 40 | 30 days supply | Qty: 30 | Fill #5

## 2018-06-29 MED FILL — ?METOPROLOL TARTRATE 25 MG: 25 | 30 days supply | Qty: 30 | Fill #5

## 2018-07-26 MED FILL — TRUE METRIX TEST STRIP: 30 days supply | Qty: 100 | Fill #10

## 2018-07-26 MED FILL — FARXIGA 10 MG TABLET: 10 | 30 days supply | Qty: 30 | Fill #5

## 2018-07-26 MED FILL — TRUEplus LANCETS 28G MISC: 30 days supply | Qty: 100 | Fill #9

## 2018-07-26 MED FILL — ?LOVASTATIN 40MG TABS: 40 | 30 days supply | Qty: 30 | Fill #6

## 2018-07-27 MED FILL — !LINZESS 72 MCG CAPSULE: 72 MCG | 30 days supply | Qty: 30 | Fill #2

## 2018-07-27 MED FILL — ?METOPROLOL TARTRATE 25 MG: 25 | 30 days supply | Qty: 30 | Fill #6

## 2018-07-27 MED FILL — ?METFORMIN HCL 1000 MG TAB: 1000 | 30 days supply | Qty: 60 | Fill #6

## 2018-07-27 MED FILL — ?GLIPIZIDE 5MG TABLET: 5 | 30 days supply | Qty: 60 | Fill #5

## 2018-08-22 ENCOUNTER — Ambulatory Visit: Payer: Self-pay | Attending: Family Medicine

## 2018-08-22 ENCOUNTER — Other Ambulatory Visit: Payer: Self-pay

## 2018-08-22 MED FILL — ?LOVASTATIN 40MG TABS: 40 | 30 days supply | Qty: 30 | Fill #7

## 2018-08-22 MED FILL — TRUE METRIX TEST STRIP: 30 days supply | Qty: 100 | Fill #11

## 2018-08-22 MED FILL — TRUEplus LANCETS 28G MISC: 30 days supply | Qty: 100 | Fill #10

## 2018-08-22 MED FILL — ?METFORMIN HCL 1000 MG TAB: 1000 | 30 days supply | Qty: 60 | Fill #7

## 2018-08-22 MED FILL — ?METOPROLOL TARTRATE 25 MG: 25 | 30 days supply | Qty: 30 | Fill #7

## 2018-09-03 ENCOUNTER — Ambulatory Visit (INDEPENDENT_AMBULATORY_CARE_PROVIDER_SITE_OTHER): Payer: Self-pay | Admitting: Primary Care

## 2018-09-05 ENCOUNTER — Ambulatory Visit (INDEPENDENT_AMBULATORY_CARE_PROVIDER_SITE_OTHER): Payer: Self-pay | Admitting: Primary Care

## 2018-09-17 MED FILL — ?METOPROLOL 25 MG TABLET: 25 | 90 days supply | Qty: 90 | Fill #8

## 2018-09-17 MED FILL — ?METFORMIN HCL 1000 MG TAB: 1000 | 90 days supply | Qty: 180 | Fill #8

## 2018-09-17 MED FILL — ?LOVASTATIN 40MG TABS: 40 | 90 days supply | Qty: 90 | Fill #8

## 2018-09-18 ENCOUNTER — Encounter: Payer: Self-pay | Admitting: Primary Care

## 2018-09-18 ENCOUNTER — Other Ambulatory Visit: Payer: Self-pay

## 2018-09-18 ENCOUNTER — Ambulatory Visit: Payer: Self-pay | Attending: Primary Care | Admitting: Primary Care

## 2018-09-18 DIAGNOSIS — N400 Enlarged prostate without lower urinary tract symptoms: Secondary | ICD-10-CM

## 2018-09-18 DIAGNOSIS — G459 Transient cerebral ischemic attack, unspecified: Secondary | ICD-10-CM

## 2018-09-18 DIAGNOSIS — E119 Type 2 diabetes mellitus without complications: Secondary | ICD-10-CM

## 2018-09-18 DIAGNOSIS — Z794 Long term (current) use of insulin: Secondary | ICD-10-CM

## 2018-09-18 DIAGNOSIS — I10 Essential (primary) hypertension: Secondary | ICD-10-CM

## 2018-09-18 NOTE — Progress Notes (Signed)
Talk about his blood sugar today 150  Talk about TIA([place] when he had his stroke 2 mo ago)  Referral to either Cardiologist or Neurologist   First TIA happened in [place] 2016  Talk about his Cholesterol medication    Talk about his Prostate Problem (bladder is not completely emptying)

## 2018-09-18 NOTE — Progress Notes (Signed)
New Patient Office Visit  Subjective:  Patient ID: Jordan Bryant, male    DOB: 02-01-1956  Age: 63 y.o. MRN: [id number]  CC:  Chief Complaint  Patient presents with  . Heat Exposure  . Medication Management  . Transient Ischemic Attack    HPI Kourtland Vajda calls for a referral for cardiology and neurology because while he was in [place] when the event happen and was treated there. I questioned the event because there was no mention of in hospital records. Asked patient to bring hospital records so we can place information in his chart. Also, mentioned to patient he has no insurance I can refer but he will need to make payment arrangement. He also concern about about his urine is hesitant and he is using the bathroom more frequently. States about 6 years ago he had PSA. Lastly he wants blood work done his CBG at home was 158.   Past Medical History:  Diagnosis Date  . Asthma   . Coronary artery disease   . Diabetes mellitus without complication ([place])   . Stroke [facility])     Past Surgical History:  Procedure Laterality Date  . Kidney Stones    . PROSTATE SURGERY      Family History  Problem Relation Age of Onset  . Colon cancer Neg Hx   . Esophageal cancer Neg Hx   . Liver cancer Neg Hx   . Pancreatic cancer Neg Hx   . Rectal cancer Neg Hx   . Stomach cancer Neg Hx     Social History   Socioeconomic History  . Marital status: Married    Spouse name: Not on file  . Number of children: Not on file  . Years of education: Not on file  . Highest education level: Not on file  Occupational History  . Not on file  Social Needs  . Financial resource strain: Not on file  . Food insecurity:    Worry: Not on file    Inability: Not on file  . Transportation needs:    Medical: Not on file    Non-medical: Not on file  Tobacco Use  . Smoking status: Never Smoker  . Smokeless tobacco: Never Used  Substance and Sexual Activity  . Alcohol use: No  . Drug use: No  . Sexual  activity: Yes  Lifestyle  . Physical activity:    Days per week: Not on file    Minutes per session: Not on file  . Stress: Not on file  Relationships  . Social connections:    Talks on phone: Not on file    Gets together: Not on file    Attends religious service: Not on file    Active member of club or organization: Not on file    Attends meetings of clubs or organizations: Not on file    Relationship status: Not on file  . Intimate partner violence:    Fear of current or ex partner: Not on file    Emotionally abused: Not on file    Physically abused: Not on file    Forced sexual activity: Not on file  Other Topics Concern  . Not on file  Social History Narrative  . Not on file    ROS Review of Systems  Constitutional: Negative.   Respiratory: Negative.   Endocrine: Positive for polyuria.  Genitourinary: Positive for difficulty urinating and frequency.    Objective:   Today's Vitals: There were no vitals taken for this visit.  Physical  Exam  Assessment & Plan:   Problem List Items Addressed This Visit    None      Outpatient Encounter Medications as of 09/18/2018  Medication Sig  . aspirin 81 MG chewable tablet Chew by mouth daily.  . Blood Glucose Monitoring Suppl (TRUE METRIX AIR GLUCOSE METER) w/Device KIT 1 each by Does not apply route as directed.  . dapagliflozin propanediol (FARXIGA) 10 MG TABS tablet Take 10 mg by mouth daily.  . Exenatide ER 2 MG/0.85ML AUIJ Inject 2 mg into the skin once a week.  . fluocinonide ointment (LIDEX) 5.78 % APPLY 1 APPLICATION TOPICALLY 2 (TWO) TIMES DAILY.  Marland Kitchen gabapentin (NEURONTIN) 100 MG capsule Take 1 capsule (100 mg total) by mouth 3 (three) times daily.  Marland Kitchen glucose blood test strip Use as instructed  . glyBURIDE (DIABETA) 5 MG tablet Take 5 mg by mouth daily with breakfast.  . Lancets MISC 1 each by Does not apply route 3 (three) times daily.  Marland Kitchen linaclotide (LINZESS) 72 MCG capsule Take 1 capsule (72 mcg total) by mouth  daily before breakfast.  . lovastatin (MEVACOR) 40 MG tablet Take 1 tablet (40 mg total) by mouth at bedtime.  . metFORMIN (GLUCOPHAGE) 1000 MG tablet TAKE 1 TABLET (1,000 MG TOTAL) BY MOUTH 2 (TWO) TIMES DAILY WITH A MEAL.  . metoprolol tartrate (LOPRESSOR) 25 MG tablet Take 0.5 tablets (12.5 mg total) by mouth 2 (two) times daily.  . carbamide peroxide (DEBROX) 6.5 % OTIC solution Place 5 drops into the left ear 2 (two) times daily. (Patient not taking: Reported on 09/18/2018)  . naproxen (NAPROSYN) 500 MG tablet Take 1 tablet (500 mg total) by mouth 2 (two) times daily with a meal. (Patient not taking: Reported on 09/18/2018)  . niacin 100 MG tablet Take 1 tablet (100 mg total) by mouth at bedtime. (Patient not taking: Reported on 02/06/2018)   Facility-Administered Encounter Medications as of 09/18/2018  Medication  . 0.9 %  sodium chloride infusion    Follow-up: No follow-ups on file.   Kerin Perna, NP

## 2018-09-19 ENCOUNTER — Ambulatory Visit: Payer: Self-pay | Attending: Family Medicine

## 2018-09-19 DIAGNOSIS — E119 Type 2 diabetes mellitus without complications: Secondary | ICD-10-CM

## 2018-09-19 DIAGNOSIS — Z794 Long term (current) use of insulin: Secondary | ICD-10-CM

## 2018-09-19 DIAGNOSIS — G459 Transient cerebral ischemic attack, unspecified: Secondary | ICD-10-CM

## 2018-09-19 DIAGNOSIS — N400 Enlarged prostate without lower urinary tract symptoms: Secondary | ICD-10-CM

## 2018-09-19 DIAGNOSIS — I10 Essential (primary) hypertension: Secondary | ICD-10-CM

## 2018-09-20 ENCOUNTER — Other Ambulatory Visit: Payer: Self-pay | Admitting: Primary Care

## 2018-09-20 DIAGNOSIS — E119 Type 2 diabetes mellitus without complications: Secondary | ICD-10-CM

## 2018-09-20 DIAGNOSIS — Z794 Long term (current) use of insulin: Principal | ICD-10-CM

## 2018-09-20 LAB — COMPREHENSIVE METABOLIC PANEL
ALT: 12 IU/L (ref 0–44)
AST: 14 IU/L (ref 0–40)
Albumin/Globulin Ratio: 1.7 (ref 1.2–2.2)
Albumin: 4.5 g/dL (ref 3.8–4.8)
Alkaline Phosphatase: 66 IU/L (ref 39–117)
BUN/Creatinine Ratio: 19 (ref 10–24)
BUN: 21 mg/dL (ref 8–27)
Bilirubin Total: 0.4 mg/dL (ref 0.0–1.2)
CO2: 21 mmol/L (ref 20–29)
Calcium: 9.1 mg/dL (ref 8.6–10.2)
Chloride: 104 mmol/L (ref 96–106)
Creatinine, Ser: 1.12 mg/dL (ref 0.76–1.27)
GFR calc Af Amer: 80 mL/min/{1.73_m2} (ref >59)
GFR calc non Af Amer: 69 mL/min/{1.73_m2} (ref >59)
Globulin, Total: 2.6 g/dL (ref 1.5–4.5)
Glucose: 146 mg/dL — ABNORMAL HIGH (ref 65–99)
Potassium: 4.9 mmol/L (ref 3.5–5.2)
Sodium: 140 mmol/L (ref 134–144)
Total Protein: 7.1 g/dL (ref 6.0–8.5)

## 2018-09-20 LAB — HEMOGLOBIN A1C
Est. average glucose Bld gHb Est-mCnc: 160 mg/dL
Hgb A1c MFr Bld: 7.2 % — ABNORMAL HIGH (ref 4.8–5.6)

## 2018-09-20 LAB — LIPID PANEL
Chol/HDL Ratio: 2.6 ratio (ref 0.0–5.0)
Cholesterol, Total: 111 mg/dL (ref 100–199)
HDL: 43 mg/dL (ref >39)
LDL Calculated: 48 mg/dL (ref 0–99)
Triglycerides: 101 mg/dL (ref 0–149)
VLDL Cholesterol Cal: 20 mg/dL (ref 5–40)

## 2018-09-20 LAB — PSA: Prostate Specific Ag, Serum: 1.1 ng/mL (ref 0.0–4.0)

## 2018-09-20 MED ORDER — METFORMIN HCL 1000 MG PO TABS: 1000.0000 mg | ORAL_TABLET | Freq: Two times a day (BID) | ORAL | 3 refills | Status: DC

## 2018-09-20 MED ORDER — LOVASTATIN 40 MG PO TABS: 40.0000 mg | ORAL_TABLET | Freq: Every day | ORAL | 0 refills | Status: DC

## 2018-09-20 MED ORDER — GLYBURIDE 5 MG PO TABS: 10.0000 mg | ORAL_TABLET | Freq: Two times a day (BID) | ORAL | 3 refills | Status: DC

## 2018-09-20 MED FILL — glyBURIDE 5 MG TABS: 5 | 15 days supply | Qty: 60 | Fill #0

## 2018-09-24 ENCOUNTER — Telehealth: Payer: Self-pay | Admitting: Physician Assistant

## 2018-09-24 DIAGNOSIS — G459 Transient cerebral ischemic attack, unspecified: Secondary | ICD-10-CM

## 2018-09-24 NOTE — Telephone Encounter (Signed)
Patient verified DOB Patient is aware of PSA being normal. Patient Is aware of AIC increasing to 7.2 and needing to limit sugar intake. Patient is also aware of cholesterol level being normal. Patient is aware of elective appointments being scheduled out and patient will receive a call from the neurologist.

## 2018-09-24 NOTE — Telephone Encounter (Signed)
New Message  Pt calling to check on the status of his blood work and also to check on his specialist referrals. Please f/u

## 2018-09-27 ENCOUNTER — Telehealth: Payer: Self-pay | Admitting: Physician Assistant

## 2018-09-27 NOTE — Telephone Encounter (Signed)
Patient called stating they need a replacement test order done for the TRT please follow up.

## 2018-09-27 NOTE — Telephone Encounter (Signed)
Review this request please and advise

## 2018-09-30 NOTE — Telephone Encounter (Signed)
.   [place] he needs to wait until a neurologist can see him or he can make his own appt and pay out of pocket. He needs [facility] financial asst

## 2018-10-01 ENCOUNTER — Telehealth: Payer: Self-pay | Admitting: Physician Assistant

## 2018-10-01 NOTE — Telephone Encounter (Signed)
Patient is aware that his referral was sent to [facility]  Ph# [phone]. Thank You

## 2018-10-01 NOTE — Telephone Encounter (Signed)
Patient called requesting for the nurse to mail his last lab results. Patient also is requesting for his PCP to order him a TRT lab test. Patient also wants for his PCP to refer him to the Urologist since he is having problems with his urine. Patient stated that he has already spoken to his PCP regarding his urine problem. Please f/u.

## 2018-10-01 NOTE — Telephone Encounter (Signed)
Patient would like to know where his Neurology referral was sent to. Please f/u

## 2018-10-01 NOTE — Telephone Encounter (Signed)
Patient needs to complete the financial. He has been told this numerous times.

## 2018-10-02 ENCOUNTER — Telehealth: Payer: Self-pay | Admitting: *Deleted

## 2018-10-02 ENCOUNTER — Encounter: Payer: Self-pay | Admitting: *Deleted

## 2018-10-02 ENCOUNTER — Telehealth: Payer: Self-pay | Admitting: Physician Assistant

## 2018-10-02 NOTE — Telephone Encounter (Signed)
Patient called to inform PCP that he is ot taking glyburide but glipizide 5mg  twice a day, patient states that the pharmacy gave him glyburide but he requested glipizide. Please follow up.

## 2018-10-02 NOTE — Telephone Encounter (Signed)
Please verify which DM medications patient should be on.

## 2018-10-02 NOTE — Telephone Encounter (Signed)
Received call in from patient , updated EMR prior to video visit. He  verbalized understanding, appreciation.

## 2018-10-03 ENCOUNTER — Ambulatory Visit (INDEPENDENT_AMBULATORY_CARE_PROVIDER_SITE_OTHER): Payer: Self-pay | Admitting: Diagnostic Neuroimaging

## 2018-10-03 ENCOUNTER — Telehealth: Payer: Self-pay | Admitting: Diagnostic Neuroimaging

## 2018-10-03 ENCOUNTER — Other Ambulatory Visit: Payer: Self-pay

## 2018-10-03 ENCOUNTER — Encounter: Payer: Self-pay | Admitting: Diagnostic Neuroimaging

## 2018-10-03 DIAGNOSIS — G459 Transient cerebral ischemic attack, unspecified: Secondary | ICD-10-CM

## 2018-10-03 NOTE — Progress Notes (Signed)
[facility]  PATIENT: Tara Housholder DOB: [birth year]  REFERRING CLINICIAN: Sylvester Harder HISTORY FROM: patient  REASON FOR VISIT: new consult    HISTORICAL  CHIEF COMPLAINT:  No chief complaint on file.   HISTORY OF PRESENT ILLNESS:   63 year old male here for evaluation of TIA.  History of hypercholesterolemia and diabetes.  2015 patient was in [place] when all of a sudden he lost vision, had blurred vision, could not speak, symptoms lasted for 2 to 3 hours.  Patient was taken to the hospital and had TIA/stroke evaluation.  He was diagnosed with TIA.  Symptoms fully resolved.  February 2020 patient was in [place] when all of a sudden he did not feel well, felt dizzy, had a generalized weakness.  He was taken to local emergency room for evaluation.  He was found to be hypertensive 170/90.  Possible TIA diagnosis was raised.  EKG and lab testing were otherwise unremarkable.  He deferred any other testing at that time until he could come back to the [place].  Symptoms fully resolved after 1 or 2 hours.  Today patient feels well.  No residual symptoms.  He is concerned about possibility of TIA or stroke risk.  He has 2 children who are both physicians, living and working in [place] and [place].  They suggested he also follow-up with a neurologist locally.  Patient is trained as an Forensic psychologist.  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   REVIEW OF SYSTEMS: Full 14 system review of systems performed and negative with exception of: As per HPI.   ALLERGIES: No Known Allergies  HOME MEDICATIONS: Outpatient Medications Prior to Visit  Medication Sig Dispense Refill  . aspirin 81 MG chewable tablet Chew by mouth daily.    . Blood Glucose Monitoring Suppl (TRUE METRIX AIR GLUCOSE METER) w/Device KIT 1 each by Does not apply route as directed. 1 kit 0  . dapagliflozin propanediol (FARXIGA) 10 MG TABS tablet Take 10 mg  by mouth daily. 30 tablet 5  . fluocinonide ointment (LIDEX) 2.97 % APPLY 1 APPLICATION TOPICALLY 2 (TWO) TIMES DAILY. 30 g 0  . gabapentin (NEURONTIN) 100 MG capsule Take 1 capsule (100 mg total) by mouth 3 (three) times daily. (Patient not taking: Reported on 10/02/2018) 90 capsule 1  . glipiZIDE (GLUCOTROL) 5 MG tablet Take 5 mg by mouth 2 (two) times daily before a meal.    . glucose blood test strip Use as instructed (Patient not taking: Reported on 10/02/2018) 100 each 12  . Lancets MISC 1 each by Does not apply route 3 (three) times daily. (Patient not taking: Reported on 10/02/2018) 90 each 11  . linaclotide (LINZESS) 72 MCG capsule Take 1 capsule (72 mcg total) by mouth daily before breakfast. 90 capsule 3  . lovastatin (MEVACOR) 40 MG tablet Take 1 tablet (40 mg total) by mouth at bedtime. 90 tablet 0  . metFORMIN (GLUCOPHAGE) 1000 MG tablet Take 1 tablet (1,000 mg total) by mouth 2 (two) times daily with a meal. 180 tablet 3  . metoprolol tartrate (LOPRESSOR) 25 MG tablet Take 0.5 tablets (12.5 mg total) by mouth 2 (two) times daily. 90 tablet 3  . naproxen (NAPROSYN) 500 MG tablet Take 1 tablet (500 mg total) by mouth 2 (two) times daily with a meal. (Patient not taking: Reported on 09/18/2018) 30 tablet 0  . niacin 100 MG tablet Take 1 tablet (100 mg total) by mouth at bedtime. (Patient not  taking: Reported on 02/06/2018) 30 tablet 5   Facility-Administered Medications Prior to Visit  Medication Dose Route Frequency Provider Last Rate Last Dose  . 0.9 %  sodium chloride infusion  500 mL Intravenous Once Doran Stabler, MD        PAST MEDICAL HISTORY: Past Medical History:  Diagnosis Date  . Asthma   . Coronary artery disease   . Diabetes mellitus without complication ([place])   . Kidney stone   . Stroke [facility])     PAST SURGICAL HISTORY: Past Surgical History:  Procedure Laterality Date  . Kidney Stones    . PROSTATE SURGERY      FAMILY HISTORY: Family History  Problem  Relation Age of Onset  . Heart attack Mother   . Heart attack Father   . Colon cancer Neg Hx   . Esophageal cancer Neg Hx   . Liver cancer Neg Hx   . Pancreatic cancer Neg Hx   . Rectal cancer Neg Hx   . Stomach cancer Neg Hx     SOCIAL HISTORY: Social History   Socioeconomic History  . Marital status: Married    Spouse name: Not on file  . Number of children: Not on file  . Years of education: Not on file  . Highest education level: Master's degree (e.g., MA, MS, MEng, MEd, MSW, MBA)  Occupational History    Comment: part time  Social Needs  . Financial resource strain: Not on file  . Food insecurity:    Worry: Not on file    Inability: Not on file  . Transportation needs:    Medical: Not on file    Non-medical: Not on file  Tobacco Use  . Smoking status: Never Smoker  . Smokeless tobacco: Never Used  Substance and Sexual Activity  . Alcohol use: No  . Drug use: No  . Sexual activity: Yes  Lifestyle  . Physical activity:    Days per week: Not on file    Minutes per session: Not on file  . Stress: Not on file  Relationships  . Social connections:    Talks on phone: Not on file    Gets together: Not on file    Attends religious service: Not on file    Active member of club or organization: Not on file    Attends meetings of clubs or organizations: Not on file    Relationship status: Not on file  . Intimate partner violence:    Fear of current or ex partner: Not on file    Emotionally abused: Not on file    Physically abused: Not on file    Forced sexual activity: Not on file  Other Topics Concern  . Not on file  Social History Narrative   Lives with wife   Caffeine- one coffee/tea daily     PHYSICAL EXAM  VIDEO EXAM  GENERAL EXAM/CONSTITUTIONAL:  Vitals: There were no vitals filed for this visit.  There is no height or weight on file to calculate BMI. Wt Readings from Last 3 Encounters:  05/23/18 169 lb 3.2 oz (76.7 kg)  03/19/18 171 lb 9.6 oz  (77.8 kg)  02/06/18 169 lb 9.6 oz (76.9 kg)     Patient is in no distress; well developed, nourished and groomed; neck is supple   NEUROLOGIC: MENTAL STATUS:  No flowsheet data found.  awake, alert, oriented to person, place and time  recent and remote memory intact  normal attention and concentration  language fluent, comprehension  intact, naming intact  fund of knowledge appropriate  CRANIAL NERVE:   2nd, 3rd, 4th, 6th - visual fields full to confrontation, extraocular muscles intact, no nystagmus  5th - facial sensation symmetric  7th - facial strength symmetric  8th - hearing intact  11th - shoulder shrug symmetric  12th - tongue protrusion midline  MOTOR:   NO TREMOR; NO DRIFT IN BUE  SENSORY:   normal and symmetric to light touch  COORDINATION:   fine finger movements normal        DIAGNOSTIC DATA (LABS, IMAGING, TESTING) - I reviewed patient records, labs, notes, testing and imaging myself where available.  Lab Results  Component Value Date   WBC 7.4 08/23/2017   HGB 15.4 08/23/2017   HCT 45.3 08/23/2017   MCV 82 08/23/2017   PLT 201 08/23/2017      Component Value Date/Time   NA 140 09/19/2018 1023   K 4.9 09/19/2018 1023   CL 104 09/19/2018 1023   CO2 21 09/19/2018 1023   GLUCOSE 146 (H) 09/19/2018 1023   BUN 21 09/19/2018 1023   CREATININE 1.12 09/19/2018 1023   CALCIUM 9.1 09/19/2018 1023   PROT 7.1 09/19/2018 1023   ALBUMIN 4.5 09/19/2018 1023   AST 14 09/19/2018 1023   ALT 12 09/19/2018 1023   ALKPHOS 66 09/19/2018 1023   BILITOT 0.4 09/19/2018 1023   GFRNONAA 69 09/19/2018 1023   GFRAA 80 09/19/2018 1023   Lab Results  Component Value Date   CHOL 111 09/19/2018   HDL 43 09/19/2018   LDLCALC 48 09/19/2018   TRIG 101 09/19/2018   CHOLHDL 2.6 09/19/2018   Lab Results  Component Value Date   HGBA1C 7.2 (H) 09/19/2018   No results found for: [id number] Lab Results  Component Value Date   TSH 1.300 05/24/2018        ASSESSMENT AND PLAN  63 y.o. year old male here with episode in 2015 with vision disturbance and aphasia diagnosed with TIA.  Now with more recent episode of generalized weakness and dizziness in setting of hypertensive events.   Dx:  1. TIA (transient ischemic attack)      PLAN:  ABNORMAL SPELL (? Hypertensive crisis vs TIA) - check MRI brain, carotid u/s, TTE - continue aspirin, statin, diabetes medications - follow up with PCP and cardiology  Orders Placed This Encounter  Procedures  . MR BRAIN WO CONTRAST  . ECHOCARDIOGRAM COMPLETE   Return for pending if symptoms worsen or fail to improve.    Penni Bombard, MD 0/51/1021, 11:73 AM Certified in Neurology, Neurophysiology and Neuroimaging  [facility] [address] [place], [place] [address] [phone]

## 2018-10-03 NOTE — Telephone Encounter (Signed)
self pay order sent to GI . They will reach out to the pt to schedule.  °

## 2018-10-04 ENCOUNTER — Other Ambulatory Visit: Payer: Self-pay | Admitting: Primary Care

## 2018-10-04 MED ORDER — GLIPIZIDE 5 MG PO TABS: 5.0000 mg | ORAL_TABLET | Freq: Two times a day (BID) | ORAL | 3 refills | Status: DC

## 2018-10-04 MED FILL — glipiZIDE 5 MG TABS: 5 | 90 days supply | Qty: 180 | Fill #0

## 2018-10-08 ENCOUNTER — Telehealth: Payer: Self-pay | Admitting: Primary Care

## 2018-10-08 NOTE — Telephone Encounter (Signed)
Patient states he is always mailed his lab results but never received his last lab results. Please follow up.   Patient also requested a referral for urology because he is having urine problems.   Patient called stating he would also like to get a testosterone test.

## 2018-10-08 NOTE — Telephone Encounter (Signed)
Patient has been advised to complete the financial portion which is holding up his referrals. Patient will need a lab visit to check his testosterone level prior to receiving the injections. Please schedule for a tele visit.

## 2018-10-09 NOTE — Telephone Encounter (Signed)
Results have been placed in the output mail for courier.

## 2018-10-09 NOTE — Telephone Encounter (Signed)
Advised patient of message Patient states he would like his lab results mailed.

## 2018-10-11 ENCOUNTER — Other Ambulatory Visit: Payer: Self-pay

## 2018-10-12 ENCOUNTER — Other Ambulatory Visit: Payer: Self-pay

## 2018-10-12 LAB — TESTOSTERONE: Testosterone: 477 ng/dL (ref 264–916)

## 2018-10-15 ENCOUNTER — Ambulatory Visit: Payer: Self-pay | Admitting: Primary Care

## 2018-10-29 ENCOUNTER — Other Ambulatory Visit: Payer: Self-pay

## 2018-10-29 ENCOUNTER — Ambulatory Visit
Admission: RE | Admit: 2018-10-29 | Discharge: 2018-10-29 | Disposition: A | Discharge status: Home / Self Care | Payer: Self-pay | Source: Ambulatory Visit | Attending: Diagnostic Neuroimaging | Admitting: Diagnostic Neuroimaging

## 2018-10-29 DIAGNOSIS — G459 Transient cerebral ischemic attack, unspecified: Secondary | ICD-10-CM

## 2018-10-30 ENCOUNTER — Telehealth (HOSPITAL_COMMUNITY): Payer: Self-pay

## 2018-10-30 NOTE — Telephone Encounter (Signed)

## 2018-10-31 ENCOUNTER — Telehealth (HOSPITAL_COMMUNITY): Payer: Self-pay | Admitting: Cardiology

## 2018-10-31 NOTE — Telephone Encounter (Signed)

## 2018-11-01 ENCOUNTER — Ambulatory Visit (HOSPITAL_COMMUNITY): Payer: Self-pay | Attending: Diagnostic Neuroimaging

## 2018-11-01 ENCOUNTER — Telehealth: Payer: Self-pay | Admitting: *Deleted

## 2018-11-01 ENCOUNTER — Other Ambulatory Visit: Payer: Self-pay

## 2018-11-01 DIAGNOSIS — G459 Transient cerebral ischemic attack, unspecified: Secondary | ICD-10-CM | POA: Insufficient documentation

## 2018-11-01 NOTE — Telephone Encounter (Signed)
Received call from patient; advised him his MRI brain result was unremarkable , no major or acute findings. I advised he continue with medications and FU with PCP and cardiologist. He asked for a copy, and I advised I;ll have Med Records e mail him a release of information form. After he signs she can send MRI report. He confirmed e mail: [email or web address]. Will send to [employer]. Patient verbalized understanding, appreciation.

## 2018-11-01 NOTE — Telephone Encounter (Signed)
LVM requesting call back.

## 2018-11-06 ENCOUNTER — Telehealth: Payer: Self-pay | Admitting: *Deleted

## 2018-11-06 NOTE — Telephone Encounter (Signed)
Done

## 2018-11-06 NOTE — Telephone Encounter (Signed)
LVM requesting call back re: results. 

## 2018-11-06 NOTE — Telephone Encounter (Signed)
Patient returned call, and I advised him his echocardiogram result was unremarkable. He asked for a copy; I asked if he got e mail of release form last week. He stated he did not receive it. I advised will talk with Stanton Kidney and have her e mail it today. He  verbalized understanding, appreciation.

## 2018-11-08 ENCOUNTER — Ambulatory Visit: Payer: Self-pay | Attending: Primary Care | Admitting: Primary Care

## 2018-11-08 ENCOUNTER — Encounter: Payer: Self-pay | Admitting: Primary Care

## 2018-11-08 ENCOUNTER — Other Ambulatory Visit: Payer: Self-pay

## 2018-11-08 DIAGNOSIS — F419 Anxiety disorder, unspecified: Secondary | ICD-10-CM

## 2018-11-08 DIAGNOSIS — I6300 Cerebral infarction due to thrombosis of unspecified precerebral artery: Secondary | ICD-10-CM

## 2018-11-08 DIAGNOSIS — Z794 Long term (current) use of insulin: Secondary | ICD-10-CM

## 2018-11-08 DIAGNOSIS — I10 Essential (primary) hypertension: Secondary | ICD-10-CM

## 2018-11-08 DIAGNOSIS — R002 Palpitations: Secondary | ICD-10-CM

## 2018-11-08 DIAGNOSIS — E119 Type 2 diabetes mellitus without complications: Secondary | ICD-10-CM

## 2018-11-08 NOTE — Progress Notes (Signed)
Virtual Visit via Telephone Note  I connected with Jordan Bryant on 11/08/18 at 11:50 AM EDT by telephone and verified that I am speaking with the correct person using two identifiers.   I discussed the limitations, risks, security and privacy concerns of performing an evaluation and management service by telephone and the availability of in person appointments. I also discussed with the patient that there may be a patient responsible charge related to this service. The patient expressed understanding and agreed to proceed.   History of Present Illness: [name] is having a tele visit for c/o of tingling in his left side from thigh down leg than toes after standing for 10- 15 mins states this has been occurring for 3 months. At rest pain goes away and tingling. Hands and feet burning become warm and tingling. Every 2 hrs at night has to go to the bathroom with hesitancy with dribbling with urinary. Last concern heart has palpitations he states he cks his HR > 100.   Observations/Objective: Review of Systems  Constitutional: Negative.   HENT: Negative.   Eyes: Negative.   Respiratory: Negative.   Cardiovascular: Negative.   Gastrointestinal: Positive for constipation.  Genitourinary: Positive for frequency.  Musculoskeletal: Negative.   Skin: Negative.   Neurological: Positive for weakness and headaches.  Endo/Heme/Allergies: Negative.   Psychiatric/Behavioral: Negative.     Assessment and Plan: Jordan Bryant was seen today for tingling.  Diagnoses and all orders for this visit:  Essential hypertension Jordan Bryant is followed by cardiology Dr. Boyd Kerbs currently on metoprolol 12.25m twice daily will add a low-dose ACE inhibitor for renal protection secondary to diabetes.  Note echocardiogram results were unremarkable  Anxiety [name] therapy [place]IA he has been very demanding of specialty consults and not taking no for an answer.  He is  being followed by neurology for CVA and cardiology.  Note very upset and outraged that he only met [place] [facility].  Tried to explain to him the referrals for specialties he would be responsible for the remainder.  Assured me that Jordan Bryant a mistake and he will be granted 100%.  Does not take any medication at this time.  Type 2 diabetes mellitus without complication, with long-term current use of insulin (Jordan Bryant On September 19, 2018 A1c 7.2 currently taking glipizide and metformin no changes at this time did discuss low-carb diet  Cerebrovascular accident (CVA) due to thrombosis of precerebral artery (Jordan Bryant This occurred when he was out of the country he did bring his records as requested referred to a neurologist that did a MRI of the brain results were unremarkable  Palpitations Patient is requesting to go back to cardiology states his heart rate averages 100 daily he is on a beta-blocker metoprolol tartrate 12.5 twice daily this can be increased without being referred to a cardiologist.  Patient is asked to come in for a BP check.   Follow Up Instructions:    I discussed the assessment and treatment plan with the patient. The patient was provided an opportunity to ask questions and all were answered. The patient agreed with the plan and demonstrated an understanding of the instructions.   The patient was advised to call back or seek an in-person evaluation if the symptoms worsen or if the condition fails to improve as anticipated.  I provided 20 minutes of non-face-to-face time during this encounter.   MKerin Perna NP

## 2018-11-08 NOTE — Progress Notes (Signed)
Patient verified DOB Patient has taken medication. Patient has eaten today. Patient complains of left leg tingling. Patient is aware of MRI being normal and showing no changes. Patient also spoke with neurologist and was made aware of the results 2 days prior to this visit.

## 2018-11-11 MED ORDER — LISINOPRIL 5 MG PO TABS: 5.0000 mg | ORAL_TABLET | Freq: Every day | ORAL | 3 refills | Status: DC

## 2018-11-12 MED FILL — LISINOPRIL 5 MG TAB: 5 | 30 days supply | Qty: 30 | Fill #0

## 2018-12-13 MED FILL — ?LOVASTATIN 40MG TABS: 40 | 30 days supply | Qty: 30 | Fill #9

## 2018-12-13 MED FILL — metFORMIN HCL 1000 MG TABS: 1000 | 30 days supply | Qty: 60 | Fill #9

## 2018-12-13 MED FILL — ?GLIPIZIDE 5MG TABLET: 5 | 30 days supply | Qty: 60 | Fill #6

## 2018-12-24 ENCOUNTER — Telehealth (INDEPENDENT_AMBULATORY_CARE_PROVIDER_SITE_OTHER): Payer: Self-pay | Admitting: Primary Care

## 2018-12-24 DIAGNOSIS — Z794 Long term (current) use of insulin: Secondary | ICD-10-CM

## 2018-12-24 DIAGNOSIS — E119 Type 2 diabetes mellitus without complications: Secondary | ICD-10-CM

## 2018-12-24 MED FILL — GABAPENTIN 100 MG CAP: 100 | 30 days supply | Qty: 90 | Fill #1

## 2018-12-24 NOTE — Telephone Encounter (Signed)
1) Medication(s) Requested (by name): -metoprolol tartrate (LOPRESSOR) 25 MG tablet  -Lancets MISC  -dapagliflozin propanediol (FARXIGA) 10 MG TABS tablet  -glucose blood test strip   2) Pharm[facility] of Choice: -[facility] 3) Special Requests:   Approved medications will be sent to the pharmacy, we will reach out if there is an issue.  Requests made after 3pm may not be addressed until the following business day!

## 2018-12-25 MED ORDER — DAPAGLIFLOZIN PROPANEDIOL 10 MG PO TABS: 10.0000 mg | ORAL_TABLET | Freq: Every day | ORAL | 2 refills | Status: DC

## 2018-12-25 MED ORDER — GLUCOSE BLOOD VI STRP: ORAL_STRIP | 2 refills | Status: DC

## 2018-12-25 MED ORDER — METOPROLOL TARTRATE 25 MG PO TABS: 12.5000 mg | ORAL_TABLET | Freq: Two times a day (BID) | ORAL | 2 refills | Status: DC

## 2018-12-25 MED ORDER — LANCETS MISC: 1.0000 | Freq: Three times a day (TID) | 2 refills | Status: DC

## 2018-12-25 MED FILL — TRUE METRIX TEST STRIP: 25 days supply | Qty: 100 | Fill #0

## 2018-12-25 MED FILL — FARXIGA 10 MG TABLET: 10 | 30 days supply | Qty: 30 | Fill #0

## 2018-12-25 MED FILL — TRUEplus LANCETS 28G MISC: 30 days supply | Qty: 100 | Fill #0

## 2018-12-25 MED FILL — ?METOPROLOL 25 MG TABLET: 25 | 30 days supply | Qty: 30 | Fill #0

## 2018-12-25 NOTE — Addendum Note (Signed)
Addended by: Nat Christen on: 12/25/2018 02:02 PM   Modules accepted: Orders

## 2018-12-25 NOTE — Telephone Encounter (Signed)
Refills sent. Nat Christen, CMA

## 2018-12-28 ENCOUNTER — Other Ambulatory Visit: Payer: Self-pay

## 2018-12-28 ENCOUNTER — Encounter (INDEPENDENT_AMBULATORY_CARE_PROVIDER_SITE_OTHER): Payer: Self-pay | Admitting: Primary Care

## 2018-12-28 ENCOUNTER — Ambulatory Visit (INDEPENDENT_AMBULATORY_CARE_PROVIDER_SITE_OTHER): Payer: Self-pay | Admitting: Primary Care

## 2018-12-28 VITALS — BP 132/76 | HR 67 | Temp 97.7°F | Ht 68.0 in | Wt 164.2 lb

## 2018-12-28 DIAGNOSIS — E1142 Type 2 diabetes mellitus with diabetic polyneuropathy: Secondary | ICD-10-CM

## 2018-12-28 DIAGNOSIS — Z794 Long term (current) use of insulin: Secondary | ICD-10-CM

## 2018-12-28 DIAGNOSIS — R200 Anesthesia of skin: Secondary | ICD-10-CM

## 2018-12-28 DIAGNOSIS — E119 Type 2 diabetes mellitus without complications: Secondary | ICD-10-CM

## 2018-12-28 DIAGNOSIS — I10 Essential (primary) hypertension: Secondary | ICD-10-CM

## 2018-12-28 LAB — POCT GLYCOSYLATED HEMOGLOBIN (HGB A1C): Hemoglobin A1C: 6.8 % — AB (ref 4.0–5.6)

## 2018-12-28 LAB — GLUCOSE, POCT (MANUAL RESULT ENTRY): POC Glucose: 175 mg/dl — AB (ref 70–99)

## 2018-12-28 MED ORDER — GABAPENTIN 100 MG PO CAPS: 100.0000 mg | ORAL_CAPSULE | Freq: Three times a day (TID) | ORAL | 0 refills | Status: DC

## 2018-12-28 MED FILL — GABAPENTIN 100 MG CAP: 100 | 30 days supply | Qty: 90 | Fill #0

## 2018-12-28 NOTE — Progress Notes (Signed)
Pt complains that his feet and hands have felt warm for the last couple of months

## 2018-12-28 NOTE — Progress Notes (Signed)
Acute Office Visit  Subjective:    Patient ID: Jordan Bryant, male    DOB: 1955/11/09, 63 y.o.   MRN: [id number]  Chief Complaint  Patient presents with  . Follow-up    DM    HPI Patient is in today for the management of diabetes. He complains of pain in his leg that goes down to his foot only on the left side. Pain is worst standing for a period of time.   Past Medical History:  Diagnosis Date  . Asthma   . Coronary artery disease   . Diabetes mellitus without complication ([place])   . Kidney stone   . Stroke [facility])     Past Surgical History:  Procedure Laterality Date  . Kidney Stones    . PROSTATE SURGERY      Family History  Problem Relation Age of Onset  . Heart attack Mother   . Heart attack Father   . Colon cancer Neg Hx   . Esophageal cancer Neg Hx   . Liver cancer Neg Hx   . Pancreatic cancer Neg Hx   . Rectal cancer Neg Hx   . Stomach cancer Neg Hx     Social History   Socioeconomic History  . Marital status: Married    Spouse name: Not on file  . Number of children: Not on file  . Years of education: Not on file  . Highest education level: Master's degree (e.g., MA, MS, MEng, MEd, MSW, MBA)  Occupational History    Comment: part time  Social Needs  . Financial resource strain: Not on file  . Food insecurity    Worry: Not on file    Inability: Not on file  . Transportation needs    Medical: Not on file    Non-medical: Not on file  Tobacco Use  . Smoking status: Never Smoker  . Smokeless tobacco: Never Used  Substance and Sexual Activity  . Alcohol use: No  . Drug use: No  . Sexual activity: Yes  Lifestyle  . Physical activity    Days per week: Not on file    Minutes per session: Not on file  . Stress: Not on file  Relationships  . Social Herbalist on phone: Not on file    Gets together: Not on file    Attends religious service: Not on file    Active member of club or organization: Not on file    Attends meetings of clubs  or organizations: Not on file    Relationship status: Not on file  . Intimate partner violence    Fear of current or ex partner: Not on file    Emotionally abused: Not on file    Physically abused: Not on file    Forced sexual activity: Not on file  Other Topics Concern  . Not on file  Social History Narrative   Lives with wife   Caffeine- one coffee/tea daily    Outpatient Medications Prior to Visit  Medication Sig Dispense Refill  . aspirin 81 MG chewable tablet Chew by mouth daily.    . Blood Glucose Monitoring Suppl (TRUE METRIX AIR GLUCOSE METER) w/Device KIT 1 each by Does not apply route as directed. 1 kit 0  . dapagliflozin propanediol (FARXIGA) 10 MG TABS tablet Take 10 mg by mouth daily. 30 tablet 2  . glipiZIDE (GLUCOTROL) 5 MG tablet Take 1 tablet (5 mg total) by mouth 2 (two) times daily before a meal. 120 tablet 3  .  glucose blood test strip Use as instructed 100 each 2  . Lancets MISC 1 each by Does not apply route 3 (three) times daily. 90 each 2  . linaclotide (LINZESS) 72 MCG capsule Take 1 capsule (72 mcg total) by mouth daily before breakfast. 90 capsule 3  . lisinopril (ZESTRIL) 5 MG tablet Take 1 tablet (5 mg total) by mouth daily. 90 tablet 3  . lovastatin (MEVACOR) 40 MG tablet Take 1 tablet (40 mg total) by mouth at bedtime. 90 tablet 0  . metFORMIN (GLUCOPHAGE) 1000 MG tablet Take 1 tablet (1,000 mg total) by mouth 2 (two) times daily with a meal. 180 tablet 3  . metoprolol tartrate (LOPRESSOR) 25 MG tablet Take 0.5 tablets (12.5 mg total) by mouth 2 (two) times daily. 90 tablet 2  . fluocinonide ointment (LIDEX) 1.02 % APPLY 1 APPLICATION TOPICALLY 2 (TWO) TIMES DAILY. 30 g 0  . gabapentin (NEURONTIN) 100 MG capsule Take 1 capsule (100 mg total) by mouth 3 (three) times daily. (Patient not taking: Reported on 10/02/2018) 90 capsule 1  . naproxen (NAPROSYN) 500 MG tablet Take 1 tablet (500 mg total) by mouth 2 (two) times daily with a meal. (Patient not taking:  Reported on 09/18/2018) 30 tablet 0  . niacin 100 MG tablet Take 1 tablet (100 mg total) by mouth at bedtime. (Patient not taking: Reported on 02/06/2018) 30 tablet 5   Facility-Administered Medications Prior to Visit  Medication Dose Route Frequency Provider Last Rate Last Dose  . 0.9 %  sodium chloride infusion  500 mL Intravenous Once Nelida Meuse III, MD        No Known Allergies  Review of Systems  HENT: Positive for hearing loss.   Gastrointestinal: Positive for constipation.  Genitourinary: Positive for frequency.  All other systems reviewed and are negative.      Objective:    Physical Exam  Constitutional: He is oriented to person, place, and time. He appears well-developed and well-nourished.  HENT:  Head: Normocephalic.  Neck: Normal range of motion.  Cardiovascular: Normal rate and regular rhythm.  Pulmonary/Chest: Effort normal and breath sounds normal.  Abdominal: Soft. Bowel sounds are normal. He exhibits distension.  Musculoskeletal: Normal range of motion.  Neurological: He is oriented to person, place, and time.  Skin: Skin is warm and dry.  Psychiatric: He has a normal mood and affect.    BP 132/76 (BP Location: Left Arm, Patient Position: Sitting, Cuff Size: Normal)   Pulse 67   Temp 97.7 F (36.5 C) (Oral)   Ht 5' 8"  (1.727 m)   Wt 164 lb 3.2 oz (74.5 kg)   SpO2 95%   BMI 24.97 kg/m  Wt Readings from Last 3 Encounters:  12/28/18 164 lb 3.2 oz (74.5 kg)  05/23/18 169 lb 3.2 oz (76.7 kg)  03/19/18 171 lb 9.6 oz (77.8 kg)    Health Maintenance Due  Topic Date Due  . FOOT EXAM  05/27/1964  . OPHTHALMOLOGY EXAM  05/27/1964    There are no preventive care reminders to display for this patient.   Lab Results  Component Value Date   TSH 1.300 05/24/2018   Lab Results  Component Value Date   WBC 7.4 08/23/2017   HGB 15.4 08/23/2017   HCT 45.3 08/23/2017   MCV 82 08/23/2017   PLT 201 08/23/2017   Lab Results  Component Value Date   NA  140 09/19/2018   K 4.9 09/19/2018   CO2 21 09/19/2018   GLUCOSE 146 (  H) 09/19/2018   BUN 21 09/19/2018   CREATININE 1.12 09/19/2018   BILITOT 0.4 09/19/2018   ALKPHOS 66 09/19/2018   AST 14 09/19/2018   ALT 12 09/19/2018   PROT 7.1 09/19/2018   ALBUMIN 4.5 09/19/2018   CALCIUM 9.1 09/19/2018   Lab Results  Component Value Date   CHOL 111 09/19/2018   Lab Results  Component Value Date   HDL 43 09/19/2018   Lab Results  Component Value Date   LDLCALC 48 09/19/2018   Lab Results  Component Value Date   TRIG 101 09/19/2018   Lab Results  Component Value Date   CHOLHDL 2.6 09/19/2018   Lab Results  Component Value Date   HGBA1C 6.8 (A) 12/28/2018       Assessment & Plan:   Akshath was seen today for follow-up.  Diagnoses and all orders for this visit:  Type 2 diabetes mellitus without complication, with long-term current use of insulin ([place]) ADA recommends the following therapeutic goals for glycemic control related to A1c measurements: Goal of therapy: Less than 6.5 hemoglobin A1c.  Reference clinical practice recommendations. Foods that are high in carbohydrates are the following rice, potatoes, breads, sugars, and pastas.  Reduction in the intake (eating) will assist in lowering your blood sugars. -     HgB A1c -     Glucose (CBG)  Diabetic polyneuropathy associated with type 2 diabetes mellitus (HCC) Peripheral neuropathy develops slowly over time leading to a loss of sensation. Burning, stabbing, or aching pain in the legs or feet are common symptoms.This can lead to calluses or sores on areas of constant pressure, reduced ability to feel temperature changes. Diabetic foot exam should be done at each visit.   Left leg numbness Secondary to diabetic neuropathy will try gabapentin (NEURONTIN) 100 MG capsule; Take 1 capsule (100 mg total) by mouth 3 (three) times daily.  Essential hypertension Counseled on blood pressure goal of less than 130/80, low-sodium,  DASH diet, medication compliance, 150 minutes of moderate intensity exercise per week. Discussed medication compliance, adverse effects.  Other orders -     gabapentin (NEURONTIN) 100 MG capsule; Take 1 capsule (100 mg total) by mouth 3 (three) times daily.    Meds ordered this encounter  Medications  . gabapentin (NEURONTIN) 100 MG capsule    Sig: Take 1 capsule (100 mg total) by mouth 3 (three) times daily.    Dispense:  90 capsule    Refill:  0     Kerin Perna, NP

## 2018-12-28 NOTE — Patient Instructions (Signed)

## 2019-01-17 MED FILL — ?GLIPIZIDE 5MG TABLET: 5 | 30 days supply | Qty: 60 | Fill #7

## 2019-01-17 MED FILL — ?LOVASTATIN 40MG TABS: 40 | 30 days supply | Qty: 30 | Fill #0

## 2019-01-17 MED FILL — metFORMIN HCL 1000 MG TABS: 1000 | 30 days supply | Qty: 60 | Fill #0

## 2019-01-18 ENCOUNTER — Other Ambulatory Visit (INDEPENDENT_AMBULATORY_CARE_PROVIDER_SITE_OTHER): Payer: Self-pay

## 2019-01-18 DIAGNOSIS — Z794 Long term (current) use of insulin: Secondary | ICD-10-CM

## 2019-01-18 DIAGNOSIS — E119 Type 2 diabetes mellitus without complications: Secondary | ICD-10-CM

## 2019-01-18 MED ORDER — METFORMIN HCL 1000 MG PO TABS: 1000.0000 mg | ORAL_TABLET | Freq: Two times a day (BID) | ORAL | 1 refills | Status: DC

## 2019-01-18 MED ORDER — LOVASTATIN 40 MG PO TABS: 40.0000 mg | ORAL_TABLET | Freq: Every day | ORAL | 1 refills | Status: DC

## 2019-01-18 NOTE — Telephone Encounter (Signed)
Patient called to request refill of lovastatin and metformin. Sent to [facility]. Nat Christen, CMA

## 2019-01-21 MED FILL — ?METOPROLOL 25 MG TABLET: 25 | 30 days supply | Qty: 30 | Fill #1

## 2019-01-21 MED FILL — FARXIGA 10 MG TABLET: 10 | 30 days supply | Qty: 30 | Fill #1

## 2019-02-12 MED FILL — metFORMIN HCL 1000 MG TABS: 1000 | 30 days supply | Qty: 60 | Fill #1

## 2019-02-12 MED FILL — LOVASTATIN 40 MG TABS: 40 | 30 days supply | Qty: 30 | Fill #1

## 2019-02-12 MED FILL — METOPROLOL TARTRATE 25 MG T: 25 | 30 days supply | Qty: 30 | Fill #2

## 2019-02-12 MED FILL — glipiZIDE 5 MG TABS: 5 | 90 days supply | Qty: 180 | Fill #1

## 2019-02-26 ENCOUNTER — Other Ambulatory Visit: Payer: Self-pay

## 2019-02-26 ENCOUNTER — Ambulatory Visit: Payer: Self-pay | Attending: Family Medicine

## 2019-03-06 ENCOUNTER — Ambulatory Visit: Payer: Self-pay | Attending: Family Medicine

## 2019-03-06 ENCOUNTER — Other Ambulatory Visit: Payer: Self-pay

## 2019-03-11 ENCOUNTER — Encounter (INDEPENDENT_AMBULATORY_CARE_PROVIDER_SITE_OTHER): Payer: Self-pay | Admitting: Primary Care

## 2019-03-11 ENCOUNTER — Other Ambulatory Visit: Payer: Self-pay

## 2019-03-11 ENCOUNTER — Ambulatory Visit (INDEPENDENT_AMBULATORY_CARE_PROVIDER_SITE_OTHER): Payer: Self-pay | Admitting: Primary Care

## 2019-03-11 DIAGNOSIS — Z794 Long term (current) use of insulin: Secondary | ICD-10-CM

## 2019-03-11 DIAGNOSIS — I10 Essential (primary) hypertension: Secondary | ICD-10-CM

## 2019-03-11 DIAGNOSIS — E119 Type 2 diabetes mellitus without complications: Secondary | ICD-10-CM

## 2019-03-11 MED FILL — metFORMIN HCL 1000 MG TABS: 1000 | 30 days supply | Qty: 60 | Fill #2

## 2019-03-11 MED FILL — METOPROLOL TARTRATE 25 MG T: 25 | 30 days supply | Qty: 30 | Fill #3

## 2019-03-11 MED FILL — LOVASTATIN 40 MG TABS: 40 | 30 days supply | Qty: 30 | Fill #2

## 2019-03-11 NOTE — Progress Notes (Signed)
Virtual Visit via Telephone Note  I connected with Jordan Bryant on 03/11/19 at  4:10 PM EDT by telephone and verified that I am speaking with the correct person using two identifiers.   I discussed the limitations, risks, security and privacy concerns of performing an evaluation and management service by telephone and the availability of in person appointments. I also discussed with the patient that there may be a patient responsible charge related to this service. The patient expressed understanding and agreed to proceed.   History of Present Illness: [name] is having a tele visit for concern that his hands and feet are warm. He is requesting labs Magnesium, Thyroid panel, A1C, Lipids, Calcium and vitamin D. Tried to explain to patient there has to be a reason to order labs. He said I am 63 years old and I don't want something wrong with his body due to negligence.     Past Medical History:  Diagnosis Date  . Asthma   . Coronary artery disease   . Diabetes mellitus without complication (HCC)   . Kidney stone   . Stroke (HCC)    Observations/Objective: Review of Systems  Skin:       Complaints of warm hand and feet  All other systems reviewed and are negative.   Assessment and Plan: Dara was seen today for warmth.  Diagnoses and all orders for this visit:  Type 2 diabetes mellitus without complication, with long-term current use of insulin (HCC) ADA recommends the following therapeutic goals for glycemic control related to A1c measurements: Goal of therapy: Less than 6.5 hemoglobin A1c.  Reference clinical practice recommendations. Foods that are high in carbohydrates are the following rice, potatoes, breads, sugars, and pastas.  Reduction in the intake (eating) will assist in lowering your blood sugars. -     TSH; Future -     Magnesium; Future -     Vitamin B12; Future  Essential hypertension Counseled on blood pressure goal of less than 130/80, low-sodium, DASH  diet, medication compliance, 150 minutes of moderate intensity exercise per week. Discussed medication compliance, adverse effects. -     Lipid panel; Future -     CMP14+EGFR; Future -     Magnesium; Future -     Vitamin B12; Future -     VITAMIN D 25 Hydroxy (Vit-D Deficiency, Fractures); Future    Follow Up Instructions:    I discussed the assessment and treatment plan with the patient. The patient was provided an opportunity to ask questions and all were answered. The patient agreed with the plan and demonstrated an understanding of the instructions.   The patient was advised to call back or seek an in-person evaluation if the symptoms worsen or if the condition fails to improve as anticipated.  I provided 20  minutes of non-face-to-face time during this encounter.    P , NP   

## 2019-03-11 NOTE — Progress Notes (Signed)
Pt complains that his hands and feet are warm; mostly at night; ongoing for the past few months.

## 2019-03-13 ENCOUNTER — Other Ambulatory Visit (INDEPENDENT_AMBULATORY_CARE_PROVIDER_SITE_OTHER): Payer: Self-pay

## 2019-03-14 ENCOUNTER — Other Ambulatory Visit (INDEPENDENT_AMBULATORY_CARE_PROVIDER_SITE_OTHER): Payer: Self-pay

## 2019-03-14 ENCOUNTER — Other Ambulatory Visit: Payer: Self-pay

## 2019-03-14 DIAGNOSIS — Z794 Long term (current) use of insulin: Secondary | ICD-10-CM

## 2019-03-14 DIAGNOSIS — E119 Type 2 diabetes mellitus without complications: Secondary | ICD-10-CM

## 2019-03-14 DIAGNOSIS — I10 Essential (primary) hypertension: Secondary | ICD-10-CM

## 2019-03-15 LAB — VITAMIN D 25 HYDROXY (VIT D DEFICIENCY, FRACTURES): Vit D, 25-Hydroxy: 45.4 ng/mL (ref 30.0–100.0)

## 2019-03-15 LAB — CMP14+EGFR
ALT: 6 IU/L (ref 0–44)
AST: 14 IU/L (ref 0–40)
Albumin/Globulin Ratio: 1.8 (ref 1.2–2.2)
Albumin: 4.5 g/dL (ref 3.8–4.8)
Alkaline Phosphatase: 65 IU/L (ref 39–117)
BUN/Creatinine Ratio: 15 (ref 10–24)
BUN: 15 mg/dL (ref 8–27)
Bilirubin Total: 0.5 mg/dL (ref 0.0–1.2)
CO2: 19 mmol/L — ABNORMAL LOW (ref 20–29)
Calcium: 9 mg/dL (ref 8.6–10.2)
Chloride: 106 mmol/L (ref 96–106)
Creatinine, Ser: 1.03 mg/dL (ref 0.76–1.27)
GFR calc Af Amer: 88 mL/min/{1.73_m2} (ref >59)
GFR calc non Af Amer: 76 mL/min/{1.73_m2} (ref >59)
Globulin, Total: 2.5 g/dL (ref 1.5–4.5)
Glucose: 138 mg/dL — ABNORMAL HIGH (ref 65–99)
Potassium: 4.7 mmol/L (ref 3.5–5.2)
Sodium: 138 mmol/L (ref 134–144)
Total Protein: 7 g/dL (ref 6.0–8.5)

## 2019-03-15 LAB — LIPID PANEL
Chol/HDL Ratio: 3.2 ratio (ref 0.0–5.0)
Cholesterol, Total: 184 mg/dL (ref 100–199)
HDL: 57 mg/dL (ref >39)
LDL Chol Calc (NIH): 106 mg/dL — ABNORMAL HIGH (ref 0–99)
Triglycerides: 120 mg/dL (ref 0–149)
VLDL Cholesterol Cal: 21 mg/dL (ref 5–40)

## 2019-03-15 LAB — TSH: TSH: 1.19 u[IU]/mL (ref 0.450–4.500)

## 2019-03-15 LAB — MAGNESIUM: Magnesium: 2.2 mg/dL (ref 1.6–2.3)

## 2019-03-19 ENCOUNTER — Telehealth (INDEPENDENT_AMBULATORY_CARE_PROVIDER_SITE_OTHER): Payer: Self-pay

## 2019-03-19 NOTE — Telephone Encounter (Signed)
Patient is aware that labs are normal. Results mailed to patient at his request. Nat Christen, CMA

## 2019-03-19 NOTE — Telephone Encounter (Signed)
-----   Message from Kerin Perna, NP sent at 03/18/2019  8:07 PM EDT ----- I have reviewed all labs and they are normal

## 2019-04-04 MED FILL — metFORMIN HCL 1000 MG TABS: 1000 | 30 days supply | Qty: 60 | Fill #3

## 2019-04-04 MED FILL — METOPROLOL TARTRATE 25 MG T: 25 | 30 days supply | Qty: 30 | Fill #4

## 2019-04-04 MED FILL — TRUE METRIX TEST STRIP: 25 days supply | Qty: 100 | Fill #1

## 2019-04-04 MED FILL — LOVASTATIN 40 MG TABS: 40 | 30 days supply | Qty: 30 | Fill #0

## 2019-04-16 ENCOUNTER — Ambulatory Visit (INDEPENDENT_AMBULATORY_CARE_PROVIDER_SITE_OTHER): Payer: Self-pay | Admitting: Primary Care

## 2019-04-16 ENCOUNTER — Encounter (INDEPENDENT_AMBULATORY_CARE_PROVIDER_SITE_OTHER): Payer: Self-pay | Admitting: Primary Care

## 2019-04-16 ENCOUNTER — Other Ambulatory Visit: Payer: Self-pay

## 2019-04-16 DIAGNOSIS — M25562 Pain in left knee: Secondary | ICD-10-CM

## 2019-04-16 DIAGNOSIS — N529 Male erectile dysfunction, unspecified: Secondary | ICD-10-CM

## 2019-04-16 DIAGNOSIS — M25561 Pain in right knee: Secondary | ICD-10-CM

## 2019-04-16 DIAGNOSIS — M79641 Pain in right hand: Secondary | ICD-10-CM

## 2019-04-16 DIAGNOSIS — M79642 Pain in left hand: Secondary | ICD-10-CM

## 2019-04-16 MED ORDER — SILDENAFIL CITRATE 100 MG PO TABS: 50.0000 mg | ORAL_TABLET | Freq: Every day | ORAL | 3 refills | Status: DC | PRN

## 2019-04-16 MED FILL — SILDENAFIL CITRATE 100 MG T: 100 | 15 days supply | Qty: 5 | Fill #0

## 2019-04-16 NOTE — Progress Notes (Signed)
Virtual Visit via Telephone Note  I connected with Jordan Bryant on 04/16/19 at  2:50 PM EST by telephone and verified that I am speaking with the correct person using two identifiers.   I discussed the limitations, risks, security and privacy concerns of performing an evaluation and management service by telephone and the availability of in person appointments. I also discussed with the patient that there may be a patient responsible charge related to this service. The patient expressed understanding and agreed to proceed.   History of Present Illness: [name] is having a tele visit for pain bilateral knees and when in a sitting position it is hard to stand up without assistance. Requesting referral for orthopedics. He is also concerned with hands feel like burning not problems with using. Also requesting Viagra.  Past Medical History:  Diagnosis Date  . Asthma   . Coronary artery disease   . Diabetes mellitus without complication ([place])   . Kidney stone   . Stroke [facility])    Observations/Objective: Review of Systems  Musculoskeletal:       Bilateral knee pain Hands burn complain of burning  All other systems reviewed and are negative.  Assessment and Plan: Diagnoses and all orders for this visit:  Pain in both knees, unspecified chronicity Work on losing weight to help reduce joint pain. May alternate with heat and ice application for pain relief. May also alternate with acetaminophen and Ibuprofen as prescribed pain relief. Other alternatives include massage, acupuncture and water aerobics.  You must stay active and avoid a sedentary lifestyle. Refer to ortho  Erectile dysfunction  This is a new problem. Additional work-up.  We discussed different medications for ED including Viagra, Levitra, and Cialis. We also discussed the risks of such medications, including priapism, monocular vision loss, and hypotension with nitrates. All questions were answered. The patient opted  for:  [x]   Trial of Viagra; no contraindications. []   Trial of Cialis; no contraindications. []   Trial of Levitra; no contraindications. []   Referral to Urology for further evaluation.   Pain in both hands Order labs nothing noted to cause the burning in his hands does not cause any problems just causes him to worry.  Per patient request rheumatology.     Follow Up Instructions:    I discussed the assessment and treatment plan with the patient. The patient was provided an opportunity to ask questions and all were answered. The patient agreed with the plan and demonstrated an understanding of the instructions.   The patient was advised to call back or seek an in-person evaluation if the symptoms worsen or if the condition fails to improve as anticipated.  I provided 18 minutes of non-face-to-face time during this encounter.   Kerin Perna, NP

## 2019-05-02 ENCOUNTER — Ambulatory Visit: Payer: Self-pay | Admitting: Orthopaedic Surgery

## 2019-05-07 MED FILL — ?LOVASTATIN 40MG TABS: 40 | 30 days supply | Qty: 30 | Fill #1

## 2019-05-07 MED FILL — metFORMIN HCL 1000 MG TABS: 1000 | 30 days supply | Qty: 60 | Fill #4

## 2019-05-07 MED FILL — ?METOPROLOL 25 MG TABLET: 25 | 30 days supply | Qty: 30 | Fill #5

## 2019-05-07 MED FILL — TRUE METRIX TEST STRIP: 25 days supply | Qty: 100 | Fill #2

## 2019-05-21 ENCOUNTER — Other Ambulatory Visit (INDEPENDENT_AMBULATORY_CARE_PROVIDER_SITE_OTHER): Payer: Self-pay | Admitting: Primary Care

## 2019-05-21 ENCOUNTER — Telehealth (INDEPENDENT_AMBULATORY_CARE_PROVIDER_SITE_OTHER): Payer: Self-pay

## 2019-05-21 DIAGNOSIS — H9190 Unspecified hearing loss, unspecified ear: Secondary | ICD-10-CM

## 2019-05-21 NOTE — Telephone Encounter (Signed)
ENT referral for loss of hearing per patient

## 2019-05-21 NOTE — Telephone Encounter (Signed)
FWD to PCP. Tempestt S Roberts, CMA  

## 2019-05-21 NOTE — Telephone Encounter (Signed)
Patient called to request a referral for ENT.  Please advice [phone]  Thank you Whitney Post

## 2019-05-27 ENCOUNTER — Ambulatory Visit (INDEPENDENT_AMBULATORY_CARE_PROVIDER_SITE_OTHER): Payer: Self-pay | Admitting: Primary Care

## 2019-05-28 ENCOUNTER — Other Ambulatory Visit (INDEPENDENT_AMBULATORY_CARE_PROVIDER_SITE_OTHER): Payer: Self-pay | Admitting: Primary Care

## 2019-05-28 DIAGNOSIS — E119 Type 2 diabetes mellitus without complications: Secondary | ICD-10-CM

## 2019-05-28 NOTE — Telephone Encounter (Signed)
Sent to PCP ?

## 2019-05-30 ENCOUNTER — Ambulatory Visit: Payer: Self-pay | Admitting: Orthopaedic Surgery

## 2019-05-30 MED FILL — ?LOVASTATIN 40MG TABS: 40 | 30 days supply | Qty: 30 | Fill #2

## 2019-05-30 MED FILL — TRUE METRIX GLUCOSE TEST ST: 25 days supply | Qty: 100 | Fill #0

## 2019-05-30 MED FILL — metFORMIN HCL 1000 MG TABS: 1000 | 30 days supply | Qty: 60 | Fill #5

## 2019-05-30 MED FILL — SILDENAFIL CITRATE 100 MG T: 100 | 15 days supply | Qty: 5 | Fill #1

## 2019-05-30 MED FILL — ?METOPROLOL 25 MG TABLET: 25 | 30 days supply | Qty: 30 | Fill #6

## 2019-06-20 ENCOUNTER — Ambulatory Visit (INDEPENDENT_AMBULATORY_CARE_PROVIDER_SITE_OTHER): Payer: Medicare HMO | Admitting: Otolaryngology

## 2019-06-20 ENCOUNTER — Ambulatory Visit: Payer: Self-pay | Admitting: Orthopaedic Surgery

## 2019-06-27 ENCOUNTER — Ambulatory Visit: Payer: Self-pay

## 2019-06-27 ENCOUNTER — Ambulatory Visit (INDEPENDENT_AMBULATORY_CARE_PROVIDER_SITE_OTHER): Payer: Medicare HMO

## 2019-06-27 ENCOUNTER — Encounter: Payer: Self-pay | Admitting: Orthopaedic Surgery

## 2019-06-27 ENCOUNTER — Ambulatory Visit (INDEPENDENT_AMBULATORY_CARE_PROVIDER_SITE_OTHER): Payer: Medicare HMO | Admitting: Orthopaedic Surgery

## 2019-06-27 ENCOUNTER — Other Ambulatory Visit: Payer: Self-pay

## 2019-06-27 VITALS — Ht 68.0 in | Wt 160.0 lb

## 2019-06-27 DIAGNOSIS — M1710 Unilateral primary osteoarthritis, unspecified knee: Secondary | ICD-10-CM | POA: Diagnosis not present

## 2019-06-27 DIAGNOSIS — M25562 Pain in left knee: Secondary | ICD-10-CM

## 2019-06-27 DIAGNOSIS — G8929 Other chronic pain: Secondary | ICD-10-CM

## 2019-06-27 DIAGNOSIS — M25561 Pain in right knee: Secondary | ICD-10-CM

## 2019-06-27 NOTE — Progress Notes (Signed)
Office Visit Note   Patient: Jordan Bryant           Date of Birth: 11-01-1955           MRN: [id number] Visit Date: 06/27/2019              Requested by: Kerin Perna, NP [address] [place],  [place] [address] PCP: Kerin Perna, NP   Assessment & Plan: Visit Diagnoses:  1. Chronic pain of both knees   2. Patellofemoral arthritis both knees     Plan:  #1:  We had a discussion about treatment plans.  One would be corticosteroid injections to both knees.  After he spoke with his brother he decided not to have any injections.  He also is a diabetic. #2: Given him an exercise program #3: Voltaren gel also was suggested instead of any anti-inflammatories.  Follow-Up Instructions: Return if symptoms worsen or fail to improve.   Orders:  Orders Placed This Encounter  Procedures  . XR KNEE 3 VIEW LEFT  . XR KNEE 3 VIEW RIGHT   No orders of the defined types were placed in this encounter.     Procedures: No procedures performed   Clinical Data: No additional findings.   Subjective: Chief Complaint  Patient presents with  . Left Knee - Pain  . Right Knee - Pain   HPI Patient presents today for bilateral knee pain. They have been hurting for a couple months, but has worsened in the last 6 months. He said that they hurt anteriorly. The pain is worse upon standing from a sitting position, or sitting from a standing position. Both knees hurt equally. He takes Ibuprofen if needed. No swelling, grinding, no giving way. He walks 3-5 miles each day on a flat surface. No previous knee surgery.    Review of Systems  Constitutional: Negative for fatigue.  HENT: Negative for ear pain.   Eyes: Negative for pain.  Respiratory: Negative for shortness of breath.   Cardiovascular: Negative for leg swelling.  Gastrointestinal: Positive for constipation. Negative for diarrhea.  Endocrine: Negative for cold intolerance and heat intolerance.  Genitourinary: Positive  for difficulty urinating.  Musculoskeletal: Negative for joint swelling.  Skin: Negative for rash.  Allergic/Immunologic: Negative for food allergies.  Neurological: Negative for weakness.  Hematological: Does not bruise/bleed easily.  Psychiatric/Behavioral: Negative for sleep disturbance.     Objective: Vital Signs: Ht '5\' 8"'$  (1.727 m)   Wt 160 lb (72.6 kg)   BMI 24.33 kg/m   Physical Exam Constitutional:      Appearance: Normal appearance. He is well-developed and normal weight.  Eyes:     Pupils: Pupils are equal, round, and reactive to light.  Pulmonary:     Effort: Pulmonary effort is normal.  Skin:    General: Skin is warm and dry.  Neurological:     Mental Status: He is alert and oriented to person, place, and time.  Psychiatric:        Behavior: Behavior normal.     Ortho Exam  Examination today reveals minimal if any effusion.  No warmth or erythema.  He does have patellofemoral crepitance with range of motion bilaterally and fairly equal.  No pain to palpation over the joint lines.  Negative McMurray's.  Negative drawer.  Specialty Comments:  No specialty comments available.  Imaging: XR KNEE 3 VIEW LEFT  Result Date: 06/27/2019 Three-view x-ray of the left knee reveals good position alignment of the knee.  Does have some  mild changes in the patella on the sunrise view.  Possibly some superior spurring of the patella at the quadriceps attachment.  XR KNEE 3 VIEW RIGHT  Result Date: 06/27/2019 Three-view x-ray of the RIGHT sure if you want notes.  Motion there knee reveals good position alignment of the knee.  Does have some mild changes in the patella on the sunrise view.  Possibly some superior spurring of the patella at the quadriceps attachment.     PMFS History: Current Outpatient Medications  Medication Sig Dispense Refill  . aspirin 81 MG chewable tablet Chew by mouth daily.    . Blood Glucose Monitoring Suppl (TRUE METRIX AIR GLUCOSE METER)  w/Device KIT 1 each by Does not apply route as directed. 1 kit 0  . dapagliflozin propanediol (FARXIGA) 10 MG TABS tablet Take 10 mg by mouth daily. 30 tablet 2  . fluocinonide ointment (LIDEX) 7.40 % APPLY 1 APPLICATION TOPICALLY 2 (TWO) TIMES DAILY. 30 g 0  . gabapentin (NEURONTIN) 100 MG capsule Take 1 capsule (100 mg total) by mouth 3 (three) times daily. 90 capsule 0  . glipiZIDE (GLUCOTROL) 5 MG tablet Take 1 tablet (5 mg total) by mouth 2 (two) times daily before a meal. 120 tablet 3  . glucose blood test strip Use as instructed 100 each 2  . Lancets MISC 1 each by Does not apply route 3 (three) times daily. 90 each 2  . linaclotide (LINZESS) 72 MCG capsule Take 1 capsule (72 mcg total) by mouth daily before breakfast. 90 capsule 3  . lisinopril (ZESTRIL) 5 MG tablet Take 1 tablet (5 mg total) by mouth daily. 90 tablet 3  . lovastatin (MEVACOR) 40 MG tablet Take 1 tablet (40 mg total) by mouth at bedtime. 90 tablet 1  . metFORMIN (GLUCOPHAGE) 1000 MG tablet Take 1 tablet (1,000 mg total) by mouth 2 (two) times daily with a meal. 180 tablet 1  . metoprolol tartrate (LOPRESSOR) 25 MG tablet Take 0.5 tablets (12.5 mg total) by mouth 2 (two) times daily. 90 tablet 2  . sildenafil (VIAGRA) 100 MG tablet Take 0.5-1 tablets (50-100 mg total) by mouth daily as needed for erectile dysfunction. 5 tablet 3   Current Facility-Administered Medications  Medication Dose Route Frequency Provider Last Rate Last Admin  . 0.9 %  sodium chloride infusion  500 mL Intravenous Once Doran Stabler, MD        Patient Active Problem List   Diagnosis Date Noted  . Patellofemoral arthritis both knees 06/27/2019  . Type 2 diabetes mellitus without complication, with long-term current use of insulin ([place]) 11/22/2016   Past Medical History:  Diagnosis Date  . Asthma   . Coronary artery disease   . Diabetes mellitus without complication ([place])   . Kidney stone   . Stroke [facility])     Family History  Problem  Relation Age of Onset  . Heart attack Mother   . Heart attack Father   . Colon cancer Neg Hx   . Esophageal cancer Neg Hx   . Liver cancer Neg Hx   . Pancreatic cancer Neg Hx   . Rectal cancer Neg Hx   . Stomach cancer Neg Hx     Past Surgical History:  Procedure Laterality Date  . Kidney Stones    . PROSTATE SURGERY     Social History   Occupational History    Comment: part time  Tobacco Use  . Smoking status: Never Smoker  . Smokeless tobacco: Never Used  Substance and Sexual Activity  . Alcohol use: No  . Drug use: No  . Sexual activity: Yes

## 2019-06-28 MED FILL — METOPROLOL TARTRATE 25 MG T: 25 | 30 days supply | Qty: 30 | Fill #7

## 2019-06-28 MED FILL — FARXIGA 10 MG TABLET: 10 | 30 days supply | Qty: 30 | Fill #2

## 2019-06-28 MED FILL — LOVASTATIN 40 MG TABS: 40 | 30 days supply | Qty: 30 | Fill #3

## 2019-06-28 MED FILL — metFORMIN HCL 1000 MG TABS: 1000 | 30 days supply | Qty: 60 | Fill #6

## 2019-06-28 MED FILL — glipiZIDE 5 MG TABS: 5 | 30 days supply | Qty: 60 | Fill #3

## 2019-07-01 ENCOUNTER — Ambulatory Visit (INDEPENDENT_AMBULATORY_CARE_PROVIDER_SITE_OTHER): Payer: Medicare HMO | Admitting: Otolaryngology

## 2019-07-01 ENCOUNTER — Other Ambulatory Visit: Payer: Self-pay

## 2019-07-01 ENCOUNTER — Encounter (INDEPENDENT_AMBULATORY_CARE_PROVIDER_SITE_OTHER): Payer: Self-pay | Admitting: Otolaryngology

## 2019-07-01 VITALS — Temp 97.7°F

## 2019-07-01 DIAGNOSIS — H6123 Impacted cerumen, bilateral: Secondary | ICD-10-CM | POA: Diagnosis not present

## 2019-07-01 DIAGNOSIS — H903 Sensorineural hearing loss, bilateral: Secondary | ICD-10-CM | POA: Diagnosis not present

## 2019-07-01 NOTE — Progress Notes (Signed)
HPI: Jordan Bryant is a 64 y.o. male who presents is referred by PCP for evaluation of hearing loss.  Patient has noticed a gradual decline in his hearing.  However when he scheduled the appointment he did not want a hearing test scheduled although he may have not of understood the question as he does not hear well.  Past Medical History:  Diagnosis Date  . Asthma   . Coronary artery disease   . Diabetes mellitus without complication ([place])   . Kidney stone   . Stroke [facility])    Past Surgical History:  Procedure Laterality Date  . Kidney Stones    . PROSTATE SURGERY     Social History   Socioeconomic History  . Marital status: Married    Spouse name: Not on file  . Number of children: Not on file  . Years of education: Not on file  . Highest education level: Master's degree (e.g., MA, MS, MEng, MEd, MSW, MBA)  Occupational History    Comment: part time  Tobacco Use  . Smoking status: Never Smoker  . Smokeless tobacco: Never Used  Substance and Sexual Activity  . Alcohol use: No  . Drug use: No  . Sexual activity: Yes  Other Topics Concern  . Not on file  Social History Narrative   Lives with wife   Caffeine- one coffee/tea daily   Social Determinants of Health   Financial Resource Strain:   . Difficulty of Paying Living Expenses: Not on file  Food Insecurity:   . Worried About Charity fundraiser in the Last Year: Not on file  . Ran Out of Food in the Last Year: Not on file  Transportation Needs:   . Lack of Transportation (Medical): Not on file  . Lack of Transportation (Non-Medical): Not on file  Physical Activity:   . Days of Exercise per Week: Not on file  . Minutes of Exercise per Session: Not on file  Stress:   . Feeling of Stress : Not on file  Social Connections:   . Frequency of Communication with Friends and Family: Not on file  . Frequency of Social Gatherings with Friends and Family: Not on file  . Attends Religious Services: Not on file  . Active  Member of Clubs or Organizations: Not on file  . Attends Archivist Meetings: Not on file  . Marital Status: Not on file   Family History  Problem Relation Age of Onset  . Heart attack Mother   . Heart attack Father   . Colon cancer Neg Hx   . Esophageal cancer Neg Hx   . Liver cancer Neg Hx   . Pancreatic cancer Neg Hx   . Rectal cancer Neg Hx   . Stomach cancer Neg Hx    No Known Allergies Prior to Admission medications   Medication Sig Start Date End Date Taking? Authorizing Provider  aspirin 81 MG chewable tablet Chew by mouth daily.   Yes [provider]  Blood Glucose Monitoring Suppl (TRUE METRIX AIR GLUCOSE METER) w/Device KIT 1 each by Does not apply route as directed. 11/01/16  Yes Clent Demark, PA-C  dapagliflozin propanediol (FARXIGA) 10 MG TABS tablet Take 10 mg by mouth daily. 12/25/18  Yes Kerin Perna, NP  fluocinonide ointment (LIDEX) 4.48 % APPLY 1 APPLICATION TOPICALLY 2 (TWO) TIMES DAILY. 03/08/18  Yes Clent Demark, PA-C  gabapentin (NEURONTIN) 100 MG capsule Take 1 capsule (100 mg total) by mouth 3 (three) times daily.  12/28/18  Yes Kerin Perna, NP  glipiZIDE (GLUCOTROL) 5 MG tablet Take 1 tablet (5 mg total) by mouth 2 (two) times daily before a meal. 10/04/18  Yes Kerin Perna, NP  glucose blood test strip Use as instructed 05/30/19  Yes Kerin Perna, NP  Lancets MISC 1 each by Does not apply route 3 (three) times daily. 12/25/18  Yes Kerin Perna, NP  linaclotide [facility]) 72 MCG capsule Take 1 capsule (72 mcg total) by mouth daily before breakfast. 03/13/18  Yes Clent Demark, PA-C  lisinopril (ZESTRIL) 5 MG tablet Take 1 tablet (5 mg total) by mouth daily. 11/11/18  Yes Kerin Perna, NP  lovastatin (MEVACOR) 40 MG tablet Take 1 tablet (40 mg total) by mouth at bedtime. 01/18/19  Yes Kerin Perna, NP  metFORMIN (GLUCOPHAGE) 1000 MG tablet Take 1 tablet (1,000 mg total) by mouth 2 (two)  times daily with a meal. 01/18/19  Yes Kerin Perna, NP  metoprolol tartrate (LOPRESSOR) 25 MG tablet Take 0.5 tablets (12.5 mg total) by mouth 2 (two) times daily. 12/25/18  Yes Kerin Perna, NP  sildenafil (VIAGRA) 100 MG tablet Take 0.5-1 tablets (50-100 mg total) by mouth daily as needed for erectile dysfunction. 04/16/19  Yes Kerin Perna, NP     Positive ROS: Negative  All other systems have been reviewed and were otherwise negative with the exception of those mentioned in the HPI and as above.  Physical Exam: Constitutional: Alert, well-appearing, no acute distress Ears: External ears without lesions or tenderness.  He had moderate wax buildup in both ears that was cleaned with suction and curettes.  TMs were clear bilaterally with good mobility on pneumatic otoscopy.  Hearing screening with the 512 1024 tuning fork revealed moderate SNHL in both ears.  AC > BC bilaterally. Nasal: External nose without lesions. Septum relatively midline.. Clear nasal passages. Oral: Lips and gums without lesions. Tongue and palate mucosa without lesions. Posterior oropharynx clear. Neck: No palpable adenopathy or masses Respiratory: Breathing comfortably  Skin: No facial/neck lesions or rash noted.  Cerumen impaction removal  Date/Time: 07/01/2019 5:56 PM Performed by: Rozetta Nunnery, MD Authorized by: Rozetta Nunnery, MD   Consent:    Consent obtained:  Verbal   Consent given by:  Patient   Risks discussed:  Pain and bleeding Procedure details:    Location:  L ear and R ear   Procedure type: suction and forceps   Post-procedure details:    Inspection:  TM intact and canal normal   Hearing quality:  Improved   Patient tolerance of procedure:  Tolerated well, no immediate complications Comments:     Clear TMs bilaterally.    Assessment: Cerumen buildup bilaterally. Significant bilateral sensorineural hearing loss.  Plan: Patient will be scheduled for  audiogram and evaluation for hearing aids.   Radene Journey, MD   CC:

## 2019-07-01 NOTE — Progress Notes (Signed)
Virtual Visit via Telephone Note  I connected with Jordan Bryant on 07/03/19 at  8:45 AM EST by telephone and verified that I am speaking with the correct person using two identifiers.  Location: Patient: Home Provider: Clinic This service was conducted via virtual visit.  The patient was located at home. I was located in my office.  Consent was obtained prior to the virtual visit and is aware of possible charges through their insurance for this visit.  The patient is an established patient.  Dr. Corliss Skains, MD conducted the virtual visit .  Office staff helped with scheduling follow up visits after the service was conducted.     I discussed the limitations, risks, security and privacy concerns of performing an evaluation and management service by telephone and the availability of in person appointments. I also discussed with the patient that there may be a patient responsible charge related to this service. The patient expressed understanding and agreed to proceed.  CC: History of Present Illness: Patient is a 63 year old male with a past medical history of diabetes.  He has been seen in consultation per request of his PCP for evaluation of pain in his hands.  According to patient he has been experiencing cold hands and cold feet for the last several months.  He denies any discoloration in his hands but he states his hands and feet get so cold that they become painful.  He has not noticed any joint pain or joint swelling.  He states he can finally warm up after wearing warm clothing.  He denies any history of digital ulcers.  There is no history of oral ulcers, nasal ulcers, sicca symptoms, chest pain, palpitations, lymphadenopathy, rash, fatigue.  There is no family history of autoimmune disease.  He gives history of osteoarthritis in his mother and his brother.  Review of Systems  Constitutional: Negative for malaise/fatigue.  HENT: Negative for congestion.   Eyes: Negative for pain.   Respiratory: Negative for shortness of breath.   Cardiovascular: Negative for leg swelling.  Gastrointestinal: Positive for diarrhea.  Genitourinary: Negative for urgency.  Musculoskeletal: Positive for joint pain.  Skin: Negative for rash.  Neurological: Negative for weakness.  Endo/Heme/Allergies: Does not bruise/bleed easily.  Psychiatric/Behavioral: Negative for memory loss.      Observations/Objective: Physical Exam  Constitutional: He is oriented to person, place, and time.  Neurological: He is alert and oriented to person, place, and time.  Psychiatric: Mood, memory, affect and judgment normal.    Patient reports morning stiffness for 0 .   Patient denies nocturnal pain.  Difficulty dressing/grooming: Denies Difficulty climbing stairs: Reports Difficulty getting out of chair: Reports Difficulty using hands for taps, buttons, cutlery, and/or writing: Denies  Assessment and Plan: Diagnoses and all orders for this visit:  Raynaud's disease without gangrene- Patient complains of very cold hands and feet to the point they become painful for the last several months.  He can warm up his extremities by wearing warm clothing.  He denies any history of discoloration or digital ulcers.  There is no family history of autoimmune disease.  To complete the work-up I will obtain following labs. -     CBC with Differential/Platelet; Future -     CK; Future -     Sedimentation rate; Future -     ANA; Future -     Anti-scleroderma antibody; Future -     RNP Antibody; Future -     Anti-Smith antibody; Future -  Sjogrens syndrome-A extractable nuclear antibody; Future -     Sjogrens syndrome-B extractable nuclear antibody; Future -     Anti-DNA antibody, double-stranded; Future -     C3 and C4; Future -     Beta-2 glycoprotein antibodies; Future -     Cardiolipin antibodies, IgG, IgM, IgA; Future -     Lupus Anticoagulant Eval w/Reflex; Future -     Cryoglobulin; Future -      Pan-ANCA; Future  Pain in both hands-he denies any joint pain or joint swelling.  The pain in his extremities is related to cold temperature.  Patellofemoral arthritis both knees-patient has longstanding history of osteoarthritis.  He states his knees are not currently very painful.  Type 2 diabetes mellitus without complication, with long-term current use of insulin ([facility])    Follow Up Instructions: He will follow up in 2 to 3 weeks.   I discussed the assessment and treatment plan with the patient. The patient was provided an opportunity to ask questions and all were answered. The patient agreed with the plan and demonstrated an understanding of the instructions.   The patient was advised to call back or seek an in-person evaluation if the symptoms worsen or if the condition fails to improve as anticipated.  I provided 30  minutes of non-face-to-face time during this encounter.   Bo Merino, MD

## 2019-07-02 ENCOUNTER — Encounter (INDEPENDENT_AMBULATORY_CARE_PROVIDER_SITE_OTHER): Payer: Self-pay | Admitting: Primary Care

## 2019-07-02 ENCOUNTER — Other Ambulatory Visit: Payer: Self-pay

## 2019-07-02 ENCOUNTER — Telehealth (INDEPENDENT_AMBULATORY_CARE_PROVIDER_SITE_OTHER): Payer: Self-pay

## 2019-07-02 ENCOUNTER — Ambulatory Visit (INDEPENDENT_AMBULATORY_CARE_PROVIDER_SITE_OTHER): Payer: Medicare HMO | Admitting: Primary Care

## 2019-07-02 VITALS — BP 124/80 | HR 77 | Temp 97.3°F | Resp 16 | Wt 169.4 lb

## 2019-07-02 DIAGNOSIS — R351 Nocturia: Secondary | ICD-10-CM

## 2019-07-02 DIAGNOSIS — I10 Essential (primary) hypertension: Secondary | ICD-10-CM | POA: Diagnosis not present

## 2019-07-02 DIAGNOSIS — E119 Type 2 diabetes mellitus without complications: Secondary | ICD-10-CM

## 2019-07-02 LAB — POCT GLYCOSYLATED HEMOGLOBIN (HGB A1C): HbA1c, POC (controlled diabetic range): 6.7 % (ref 0.0–7.0)

## 2019-07-02 LAB — GLUCOSE, POCT (MANUAL RESULT ENTRY): POC Glucose: 147 mg/dl — AB (ref 70–99)

## 2019-07-02 MED ORDER — LOVASTATIN 40 MG PO TABS: 40.0000 mg | ORAL_TABLET | Freq: Every day | ORAL | 1 refills | Status: DC

## 2019-07-02 MED ORDER — LANCETS MISC: 1.0000 | Freq: Three times a day (TID) | 2 refills | Status: DC

## 2019-07-02 MED ORDER — METOPROLOL TARTRATE 25 MG PO TABS: 12.5000 mg | ORAL_TABLET | Freq: Two times a day (BID) | ORAL | 2 refills | Status: DC

## 2019-07-02 MED ORDER — ROSUVASTATIN CALCIUM 10 MG PO TABS: 10.0000 mg | ORAL_TABLET | Freq: Every day | ORAL | 3 refills | Status: DC

## 2019-07-02 MED ORDER — METFORMIN HCL 1000 MG PO TABS: 1000.0000 mg | ORAL_TABLET | Freq: Two times a day (BID) | ORAL | 1 refills | Status: DC

## 2019-07-02 MED ORDER — ONETOUCH VERIO FLEX SYSTEM W/DEVICE KIT: 1.0000 | PACK | Freq: Three times a day (TID) | 0 refills | Status: DC | PRN

## 2019-07-02 MED ORDER — LISINOPRIL 5 MG PO TABS: 5.0000 mg | ORAL_TABLET | Freq: Every day | ORAL | 3 refills | Status: DC

## 2019-07-02 MED ORDER — SILDENAFIL CITRATE 100 MG PO TABS: 50.0000 mg | ORAL_TABLET | Freq: Every day | ORAL | 3 refills | Status: DC | PRN

## 2019-07-02 MED ORDER — GLIPIZIDE 5 MG PO TABS: 5.0000 mg | ORAL_TABLET | Freq: Two times a day (BID) | ORAL | 3 refills | Status: DC

## 2019-07-02 NOTE — Telephone Encounter (Signed)
Patient called to inform PCP that she gave him a printed Rx for rosuvastatin (CRESTOR) 10 MG tablet  Patient states that he was taking 40 mg of Rosuvastatin and PCP advice that she would decreased the dosage to 20 mg. Patient went to pharmacy and realized she did not do the 20 mg she advice. Patient will bring in the printed Rx this afternoon.   Please follow up

## 2019-07-02 NOTE — Patient Instructions (Signed)
MANAGEMENT OF CHRONIC CONSTIPATION  Drink fluids in the recommended amount everyday. Recommend amount is 8 cups of water daily. Do not replace water with Gatorade or Powerade as these should only be used when you are dehydrated.  Eat lots of high fiber foods-fruits, veggies, bran and whole grain instead of white bread Be active everyday. Inactivity makes constipation worse. Add psyllium daily (Metamucil) which comes in capsules now. Start very low dose and work up to recommended dose on bottle daily. Stay away from Milk of Magnesia or any magnesium containing laxative, unless you need it to clear things out rarely. It is an addictive laxative and your gut will become dependent on it. If that is not working, I would start Miralax, which you can buy in generic 17 gms daily. It's a powder and not an "addictive laxative". Take it every day and titrate the dose up or down to get the daily Bm. We will consider the use of other pharmacological treatments should the above recommendations prove to be unsuccessful.   

## 2019-07-02 NOTE — Progress Notes (Signed)
Established Patient Office Visit  Subjective:  Patient ID: Jordan Bryant, male    DOB: [birth year]  Age: 64 y.o. MRN: [id number]  CC:  Chief Complaint  Patient presents with  . Diabetes  . Hypertension    HPI Jordan Bryant presents for the management of type 2 diabetes and medication refills.  Today A1C is 6.7 previously 6.8. He voices complaints of abdominal pain does not feel it is hear burn but does not have bowel movements regularly (daily)  Past Medical History:  Diagnosis Date  . Asthma   . Coronary artery disease   . Diabetes mellitus without complication ([place])   . Kidney stone   . Stroke [facility])     Past Surgical History:  Procedure Laterality Date  . Kidney Stones    . PROSTATE SURGERY      Family History  Problem Relation Age of Onset  . Heart attack Mother   . Heart attack Father   . Colon cancer Neg Hx   . Esophageal cancer Neg Hx   . Liver cancer Neg Hx   . Pancreatic cancer Neg Hx   . Rectal cancer Neg Hx   . Stomach cancer Neg Hx     Social History   Socioeconomic History  . Marital status: Married    Spouse name: Not on file  . Number of children: Not on file  . Years of education: Not on file  . Highest education level: Master's degree (e.g., MA, MS, MEng, MEd, MSW, MBA)  Occupational History    Comment: part time  Tobacco Use  . Smoking status: Never Smoker  . Smokeless tobacco: Never Used  Substance and Sexual Activity  . Alcohol use: No  . Drug use: No  . Sexual activity: Yes  Other Topics Concern  . Not on file  Social History Narrative   Lives with wife   Caffeine- one coffee/tea daily   Social Determinants of Health   Financial Resource Strain:   . Difficulty of Paying Living Expenses: Not on file  Food Insecurity:   . Worried About Charity fundraiser in the Last Year: Not on file  . Ran Out of Food in the Last Year: Not on file  Transportation Needs:   . Lack of Transportation (Medical): Not on file  . Lack of  Transportation (Non-Medical): Not on file  Physical Activity:   . Days of Exercise per Week: Not on file  . Minutes of Exercise per Session: Not on file  Stress:   . Feeling of Stress : Not on file  Social Connections:   . Frequency of Communication with Friends and Family: Not on file  . Frequency of Social Gatherings with Friends and Family: Not on file  . Attends Religious Services: Not on file  . Active Member of Clubs or Organizations: Not on file  . Attends Archivist Meetings: Not on file  . Marital Status: Not on file  Intimate Partner Violence:   . Fear of Current or Ex-Partner: Not on file  . Emotionally Abused: Not on file  . Physically Abused: Not on file  . Sexually Abused: Not on file    Outpatient Medications Prior to Visit  Medication Sig Dispense Refill  . aspirin 81 MG chewable tablet Chew by mouth daily.    . dapagliflozin propanediol (FARXIGA) 10 MG TABS tablet Take 10 mg by mouth daily. 30 tablet 2  . fluocinonide ointment (LIDEX) 6.94 % APPLY 1 APPLICATION TOPICALLY 2 (TWO) TIMES DAILY. [place]  g 0  . gabapentin (NEURONTIN) 100 MG capsule Take 1 capsule (100 mg total) by mouth 3 (three) times daily. 90 capsule 0  . glucose blood test strip Use as instructed 100 each 2  . linaclotide (LINZESS) 72 MCG capsule Take 1 capsule (72 mcg total) by mouth daily before breakfast. 90 capsule 3  . Blood Glucose Monitoring Suppl (TRUE METRIX AIR GLUCOSE METER) w/Device KIT 1 each by Does not apply route as directed. 1 kit 0  . glipiZIDE (GLUCOTROL) 5 MG tablet Take 1 tablet (5 mg total) by mouth 2 (two) times daily before a meal. 120 tablet 3  . Lancets MISC 1 each by Does not apply route 3 (three) times daily. 90 each 2  . lisinopril (ZESTRIL) 5 MG tablet Take 1 tablet (5 mg total) by mouth daily. 90 tablet 3  . lovastatin (MEVACOR) 40 MG tablet Take 1 tablet (40 mg total) by mouth at bedtime. 90 tablet 1  . metFORMIN (GLUCOPHAGE) 1000 MG tablet Take 1 tablet (1,000 mg  total) by mouth 2 (two) times daily with a meal. 180 tablet 1  . metoprolol tartrate (LOPRESSOR) 25 MG tablet Take 0.5 tablets (12.5 mg total) by mouth 2 (two) times daily. 90 tablet 2  . sildenafil (VIAGRA) 100 MG tablet Take 0.5-1 tablets (50-100 mg total) by mouth daily as needed for erectile dysfunction. 5 tablet 3   Facility-Administered Medications Prior to Visit  Medication Dose Route Frequency Provider Last Rate Last Admin  . 0.9 %  sodium chloride infusion  500 mL Intravenous Once Nelida Meuse III, MD        No Known Allergies  ROS Review of Systems  Gastrointestinal: Positive for abdominal pain and constipation.       Epigastric pain  Genitourinary: Positive for frequency.       Nocturia   All other systems reviewed and are negative.     Objective:    Physical Exam  Constitutional: He is oriented to person, place, and time. He appears well-developed and well-nourished.  HENT:  Head: Normocephalic.  Eyes: Pupils are equal, round, and reactive to light. EOM are normal.  Abdominal: Soft. Bowel sounds are normal.  Musculoskeletal:        General: Normal range of motion.     Cervical back: Neck supple.  Neurological: He is oriented to person, place, and time.  Skin: Skin is warm and dry.  Psychiatric: He has a normal mood and affect. His behavior is normal.    BP 124/80   Pulse 77   Temp (!) 97.3 F (36.3 C) (Oral)   Resp 16   Wt 169 lb 6.4 oz (76.8 kg)   SpO2 96%   BMI 25.76 kg/m  Wt Readings from Last 3 Encounters:  07/02/19 169 lb 6.4 oz (76.8 kg)  06/27/19 160 lb (72.6 kg)  12/28/18 164 lb 3.2 oz (74.5 kg)     Health Maintenance Due  Topic Date Due  . OPHTHALMOLOGY EXAM  05/27/1964  . INFLUENZA VACCINE  01/12/2019    There are no preventive care reminders to display for this patient.  Lab Results  Component Value Date   TSH 1.190 03/14/2019   Lab Results  Component Value Date   WBC 7.4 08/23/2017   HGB 15.4 08/23/2017   HCT 45.3  08/23/2017   MCV 82 08/23/2017   PLT 201 08/23/2017   Lab Results  Component Value Date   NA 138 03/14/2019   K 4.7 03/14/2019   CO2 19 (  L) 03/14/2019   GLUCOSE 138 (H) 03/14/2019   BUN 15 03/14/2019   CREATININE 1.03 03/14/2019   BILITOT 0.5 03/14/2019   ALKPHOS 65 03/14/2019   AST 14 03/14/2019   ALT 6 03/14/2019   PROT 7.0 03/14/2019   ALBUMIN 4.5 03/14/2019   CALCIUM 9.0 03/14/2019   Lab Results  Component Value Date   CHOL 184 03/14/2019   Lab Results  Component Value Date   HDL 57 03/14/2019   Lab Results  Component Value Date   LDLCALC 106 (H) 03/14/2019   Lab Results  Component Value Date   TRIG 120 03/14/2019   Lab Results  Component Value Date   CHOLHDL 3.2 03/14/2019   Lab Results  Component Value Date   HGBA1C 6.7 07/02/2019      Assessment & Plan:  Euriah was seen today for diabetes and hypertension.  Diagnoses and all orders for this visit:  Type 2 diabetes mellitus without complication, without long-term current use of insulin (HCC) A1C well controlled 6.7 only on metformin 1081m bid he also follows dietary restrictions.  -     POCT glucose (manual entry) -     POCT glycosylated hemoglobin (Hb A1C) -       Ambulatory referral to Ophthalmology -     Lancets MISC; 1 each by Does not apply route 3 (three) times daily. -     metFORMIN (GLUCOPHAGE) 1000 MG tablet; Take 1 tablet (1,000 mg total) by mouth 2 (two) times daily with a meal.  -     Essential hypertension Well controlled on lisinopril 559mdaily dual function for renal protection and metoprolol tartrate (LOPRESSOR) 25 MG tablet 2 (two) times daily. Check blood pressure daily at the same time. Keep a log of all blood pressure readings. Continue  DASH DIET; No salt or low sodium diet  IEncounter for diabetic foot exam (HCRidgeleySensory exam of the foot is normal, tested with the monofilament. Good pulses, no lesions or ulcers, good peripheral pulses.  Nocturia He states having to get up  an urinate 2-3 times a night with age rule out BPH. -     PSA  Other orders -          glipiZIDE (GLUCOTROL) 5 MG tablet; Take 1 tablet (5 mg total) by mouth 2 (two) times daily before a meal. -     lisinopril (ZESTRIL) 5 MG tablet; Take 1 tablet (5 mg total) by mouth daily. -     rosuvastatin (CRESTOR) 10 MG tablet; Take 1 tablet (10 mg total) by mouth daily. -     Blood Glucose Monitoring Suppl (ONETOUCH VERIO FLEX SYSTEM) w/Device KIT; 1 kit by Does not apply route 3 (three) times daily with meals as needed.     Meds ordered this encounter  Medications  . DISCONTD: Lancets MISC    Sig: 1 each by Does not apply route 3 (three) times daily.    Dispense:  90 each    Refill:  2  . DISCONTD: metFORMIN (GLUCOPHAGE) 1000 MG tablet    Sig: Take 1 tablet (1,000 mg total) by mouth 2 (two) times daily with a meal.    Dispense:  180 tablet    Refill:  1  . DISCONTD: metoprolol tartrate (LOPRESSOR) 25 MG tablet    Sig: Take 0.5 tablets (12.5 mg total) by mouth 2 (two) times daily.    Dispense:  90 tablet    Refill:  2  . DISCONTD: lovastatin (MEVACOR) 40 MG tablet  Sig: Take 1 tablet (40 mg total) by mouth at bedtime.    Dispense:  90 tablet    Refill:  1  . DISCONTD: glipiZIDE (GLUCOTROL) 5 MG tablet    Sig: Take 1 tablet (5 mg total) by mouth 2 (two) times daily before a meal.    Dispense:  120 tablet    Refill:  3  . DISCONTD: lisinopril (ZESTRIL) 5 MG tablet    Sig: Take 1 tablet (5 mg total) by mouth daily.    Dispense:  90 tablet    Refill:  3  . DISCONTD: sildenafil (VIAGRA) 100 MG tablet    Sig: Take 0.5-1 tablets (50-100 mg total) by mouth daily as needed for erectile dysfunction.    Dispense:  5 tablet    Refill:  3  . glipiZIDE (GLUCOTROL) 5 MG tablet    Sig: Take 1 tablet (5 mg total) by mouth 2 (two) times daily before a meal.    Dispense:  120 tablet    Refill:  3  . Lancets MISC    Sig: 1 each by Does not apply route 3 (three) times daily.    Dispense:  90 each     Refill:  2  . lisinopril (ZESTRIL) 5 MG tablet    Sig: Take 1 tablet (5 mg total) by mouth daily.    Dispense:  90 tablet    Refill:  3  . metFORMIN (GLUCOPHAGE) 1000 MG tablet    Sig: Take 1 tablet (1,000 mg total) by mouth 2 (two) times daily with a meal.    Dispense:  180 tablet    Refill:  1  . metoprolol tartrate (LOPRESSOR) 25 MG tablet    Sig: Take 0.5 tablets (12.5 mg total) by mouth 2 (two) times daily.    Dispense:  90 tablet    Refill:  2  . rosuvastatin (CRESTOR) 10 MG tablet    Sig: Take 1 tablet (10 mg total) by mouth daily.    Dispense:  90 tablet    Refill:  3  . Blood Glucose Monitoring Suppl (ONETOUCH VERIO FLEX SYSTEM) w/Device KIT    Sig: 1 kit by Does not apply route 3 (three) times daily with meals as needed.    Dispense:  1 kit    Refill:  0    Follow-up: Return in about 6 months (around 12/30/2019) for DM tele .    Kerin Perna, NP

## 2019-07-02 NOTE — Telephone Encounter (Signed)
Will forward to pcp

## 2019-07-03 ENCOUNTER — Encounter: Payer: Self-pay | Admitting: Rheumatology

## 2019-07-03 ENCOUNTER — Telehealth (INDEPENDENT_AMBULATORY_CARE_PROVIDER_SITE_OTHER): Payer: Medicare HMO | Admitting: Rheumatology

## 2019-07-03 DIAGNOSIS — R69 Illness, unspecified: Secondary | ICD-10-CM | POA: Diagnosis not present

## 2019-07-03 DIAGNOSIS — I7300 Raynaud's syndrome without gangrene: Secondary | ICD-10-CM | POA: Diagnosis not present

## 2019-07-03 DIAGNOSIS — M1710 Unilateral primary osteoarthritis, unspecified knee: Secondary | ICD-10-CM | POA: Diagnosis not present

## 2019-07-03 DIAGNOSIS — M79641 Pain in right hand: Secondary | ICD-10-CM | POA: Diagnosis not present

## 2019-07-03 DIAGNOSIS — Z794 Long term (current) use of insulin: Secondary | ICD-10-CM

## 2019-07-03 DIAGNOSIS — E119 Type 2 diabetes mellitus without complications: Secondary | ICD-10-CM

## 2019-07-03 DIAGNOSIS — M79642 Pain in left hand: Secondary | ICD-10-CM | POA: Diagnosis not present

## 2019-07-03 LAB — PSA: Prostate Specific Ag, Serum: 0.9 ng/mL (ref 0.0–4.0)

## 2019-07-03 MED FILL — ONETOUCH DELICA PLUS LANCET: 30 days supply | Qty: 100 | Fill #0

## 2019-07-03 MED FILL — ROSUVASTATIN CALCIUM 10 MG: 10 | 30 days supply | Qty: 30 | Fill #0

## 2019-07-03 MED FILL — ONETOUCH VERIO REFLECT W/DE: W/DEVICE | 1 days supply | Qty: 1 | Fill #0

## 2019-07-04 ENCOUNTER — Other Ambulatory Visit: Payer: Self-pay | Admitting: *Deleted

## 2019-07-04 DIAGNOSIS — I7300 Raynaud's syndrome without gangrene: Secondary | ICD-10-CM | POA: Diagnosis not present

## 2019-07-05 ENCOUNTER — Other Ambulatory Visit (INDEPENDENT_AMBULATORY_CARE_PROVIDER_SITE_OTHER): Payer: Self-pay | Admitting: Primary Care

## 2019-07-05 ENCOUNTER — Telehealth (INDEPENDENT_AMBULATORY_CARE_PROVIDER_SITE_OTHER): Payer: Self-pay

## 2019-07-05 ENCOUNTER — Other Ambulatory Visit (INDEPENDENT_AMBULATORY_CARE_PROVIDER_SITE_OTHER): Payer: Self-pay

## 2019-07-05 DIAGNOSIS — E119 Type 2 diabetes mellitus without complications: Secondary | ICD-10-CM

## 2019-07-05 DIAGNOSIS — H903 Sensorineural hearing loss, bilateral: Secondary | ICD-10-CM | POA: Diagnosis not present

## 2019-07-05 DIAGNOSIS — Z794 Long term (current) use of insulin: Secondary | ICD-10-CM

## 2019-07-05 DIAGNOSIS — R69 Illness, unspecified: Secondary | ICD-10-CM | POA: Diagnosis not present

## 2019-07-05 MED ORDER — ONETOUCH VERIO VI STRP: ORAL_STRIP | 12 refills | Status: DC

## 2019-07-05 MED FILL — ONE TOUCH VERIO TEST STRIP: 30 days supply | Qty: 100 | Fill #0

## 2019-07-05 NOTE — Telephone Encounter (Signed)
Pharmacy request to send test strips for new one touch verio glucometer. Rx sent. Maryjean Morn, CMA

## 2019-07-05 NOTE — Telephone Encounter (Signed)
Patient called to request refill for test Strips for his [facility] FLEX SYSTEM  Patient uses [facility]  Please [phone]

## 2019-07-05 NOTE — Telephone Encounter (Signed)
Test strips have been sent to the pharmacy. 

## 2019-07-08 ENCOUNTER — Other Ambulatory Visit: Payer: Self-pay | Admitting: *Deleted

## 2019-07-08 NOTE — Progress Notes (Signed)
I do not see a new patient follow-up visit.  Please schedule an in office visit in 1 to 2 weeks.

## 2019-07-08 NOTE — Progress Notes (Signed)
Please, add ENA, C3, C4

## 2019-07-10 LAB — LUPUS ANTICOAGULANT EVAL W/ REFLEX
PTT-LA Screen: 30 s (ref <=40)
dRVVT: 35 s (ref <=45)

## 2019-07-10 LAB — SJOGRENS SYNDROME-A EXTRACTABLE NUCLEAR ANTIBODY: SSA (Ro) (ENA) Antibody, IgG: <1 AI

## 2019-07-10 LAB — ANTI-DNA ANTIBODY, DOUBLE-STRANDED: ds DNA Ab: 2 IU/mL

## 2019-07-10 LAB — RNP ANTIBODY: Ribonucleic Protein(ENA) Antibody, IgG: <1 AI

## 2019-07-10 LAB — CBC WITH DIFFERENTIAL/PLATELET
Absolute Monocytes: 540 cells/uL (ref 200–950)
Basophils Absolute: 89 cells/uL (ref 0–200)
Basophils Relative: 1.2 %
Eosinophils Absolute: 222 cells/uL (ref 15–500)
Eosinophils Relative: 3 %
HCT: 43.4 % (ref 38.5–50.0)
Hemoglobin: 14.7 g/dL (ref 13.2–17.1)
Lymphs Abs: 1835 cells/uL (ref 850–3900)
MCH: 27.8 pg (ref 27.0–33.0)
MCHC: 33.9 g/dL (ref 32.0–36.0)
MCV: 82 fL (ref 80.0–100.0)
MPV: 10.6 fL (ref 7.5–12.5)
Monocytes Relative: 7.3 %
Neutro Abs: 4714 cells/uL (ref 1500–7800)
Neutrophils Relative %: 63.7 %
Platelets: 209 10*3/uL (ref 140–400)
RBC: 5.29 10*6/uL (ref 4.20–5.80)
RDW: 12.5 % (ref 11.0–15.0)
Total Lymphocyte: 24.8 %
WBC: 7.4 10*3/uL (ref 3.8–10.8)

## 2019-07-10 LAB — PAN-ANCA
ANCA Screen: NEGATIVE
Myeloperoxidase Abs: <1 AI
Serine Protease 3: <1 AI

## 2019-07-10 LAB — BETA-2 GLYCOPROTEIN ANTIBODIES
Beta-2 Glyco 1 IgA: 23 SAU — ABNORMAL HIGH (ref <=20)
Beta-2 Glyco 1 IgM: <9 SMU (ref <=20)
Beta-2 Glyco I IgG: <9 SGU (ref <=20)

## 2019-07-10 LAB — CARDIOLIPIN ANTIBODIES, IGG, IGM, IGA
Anticardiolipin IgA: <11 [APL'U]
Anticardiolipin IgG: <14 [GPL'U]
Anticardiolipin IgM: <12 [MPL'U]

## 2019-07-10 LAB — ANTI-SCLERODERMA ANTIBODY: Scleroderma (Scl-70) (ENA) Antibody, IgG: <1 AI

## 2019-07-10 LAB — ANA: Anti Nuclear Antibody (ANA): POSITIVE — AB

## 2019-07-10 LAB — C3 AND C4
C3 Complement: 123 mg/dL (ref 82–185)
C4 Complement: 21 mg/dL (ref 15–53)

## 2019-07-10 LAB — SJOGRENS SYNDROME-B EXTRACTABLE NUCLEAR ANTIBODY: SSB (La) (ENA) Antibody, IgG: <1 AI

## 2019-07-10 LAB — ANTI-SMITH ANTIBODY: ENA SM Ab Ser-aCnc: <1 AI

## 2019-07-10 LAB — CK: Total CK: 96 U/L (ref 44–196)

## 2019-07-10 LAB — ANTI-NUCLEAR AB-TITER (ANA TITER): ANA Titer 1: 1:80 {titer} — ABNORMAL HIGH

## 2019-07-10 LAB — CRYOGLOBULIN: Cryoglobulin, Qualitative Analysis: NOT DETECTED

## 2019-07-10 LAB — SEDIMENTATION RATE: Sed Rate: 6 mm/h (ref 0–20)

## 2019-07-10 NOTE — Progress Notes (Signed)
I will discuss results at the new patient follow-up visit

## 2019-07-16 ENCOUNTER — Telehealth (INDEPENDENT_AMBULATORY_CARE_PROVIDER_SITE_OTHER): Payer: Self-pay

## 2019-07-16 NOTE — Telephone Encounter (Signed)
Sent to PCP ?

## 2019-07-16 NOTE — Telephone Encounter (Signed)
Patient called requesting status of his urology referral. I was not able to find a urology referral. Patient states his is having problems while urinating states it takes about 2 minutes for him to urinate. Patient states that when he is walking and mostly jogging he can feel drops of urine coming out. Please referral patient to a urologist if appropriate.   Please advice [phone]

## 2019-07-19 NOTE — Progress Notes (Deleted)
Office Visit Note  Patient: Jordan Bryant             Date of Birth: 1956/03/24           MRN: [id number]             PCP: Kerin Perna, NP Referring: Kerin Perna, NP Visit Date: 07/24/2019 Occupation: @GUAROCC @  Subjective:  No chief complaint on file.   History of Present Illness: Jordan Bryant is a 64 y.o. male ***   Activities of Daily Living:  Patient reports morning stiffness for *** {minute/hour:19697}.   Patient {ACTIONS;DENIES/REPORTS:21021675::"Denies"} nocturnal pain.  Difficulty dressing/grooming: {ACTIONS;DENIES/REPORTS:21021675::"Denies"} Difficulty climbing stairs: {ACTIONS;DENIES/REPORTS:21021675::"Denies"} Difficulty getting out of chair: {ACTIONS;DENIES/REPORTS:21021675::"Denies"} Difficulty using hands for taps, buttons, cutlery, and/or writing: {ACTIONS;DENIES/REPORTS:21021675::"Denies"}  No Rheumatology ROS completed.   PMFS History:  Patient Active Problem List   Diagnosis Date Noted  . Patellofemoral arthritis both knees 06/27/2019  . Type 2 diabetes mellitus without complication, with long-term current use of insulin ([place]) 11/22/2016    Past Medical History:  Diagnosis Date  . Asthma   . Coronary artery disease   . Diabetes mellitus without complication ([place])   . Kidney stone   . Stroke [facility])     Family History  Problem Relation Age of Onset  . Heart attack Mother   . Heart attack Father   . Colon cancer Neg Hx   . Esophageal cancer Neg Hx   . Liver cancer Neg Hx   . Pancreatic cancer Neg Hx   . Rectal cancer Neg Hx   . Stomach cancer Neg Hx    Past Surgical History:  Procedure Laterality Date  . Kidney Stones    . PROSTATE SURGERY     Social History   Social History Narrative   Lives with wife   Caffeine- one coffee/tea daily   Immunization History  Administered Date(s) Administered  . Influenza,inj,Quad PF,6+ Mos 02/06/2018  . Pneumococcal Conjugate-13 02/01/2017  . Pneumococcal Polysaccharide-23 02/06/2018    . Tdap 02/01/2017     Objective: Vital Signs: There were no vitals taken for this visit.   Physical Exam   Musculoskeletal Exam: ***  CDAI Exam: CDAI Score: -- Patient Global: --; Provider Global: -- Swollen: --; Tender: -- Joint Exam 07/24/2019   No joint exam has been documented for this visit   There is currently no information documented on the homunculus. Go to the Rheumatology activity and complete the homunculus joint exam.  Investigation: No additional findings.  Imaging: XR KNEE 3 VIEW LEFT  Result Date: 06/27/2019 Three-view x-ray of the left knee reveals good position alignment of the knee.  Does have some mild changes in the patella on the sunrise view.  Possibly some superior spurring of the patella at the quadriceps attachment.  XR KNEE 3 VIEW RIGHT  Result Date: 06/27/2019 Three-view x-ray of the RIGHT sure if you want notes.  Motion there knee reveals good position alignment of the knee.  Does have some mild changes in the patella on the sunrise view.  Possibly some superior spurring of the patella at the quadriceps attachment.    Recent Labs: Lab Results  Component Value Date   WBC 7.4 07/04/2019   HGB 14.7 07/04/2019   PLT 209 07/04/2019   NA 138 03/14/2019   K 4.7 03/14/2019   CL 106 03/14/2019   CO2 19 (L) 03/14/2019   GLUCOSE 138 (H) 03/14/2019   BUN 15 03/14/2019   CREATININE 1.03 03/14/2019   BILITOT 0.5 03/14/2019  ALKPHOS 65 03/14/2019   AST 14 03/14/2019   ALT 6 03/14/2019   PROT 7.0 03/14/2019   ALBUMIN 4.5 03/14/2019   CALCIUM 9.0 03/14/2019   GFRAA 88 03/14/2019   July 03, 2018 ANA 1: 80 speckled, ENA negative, anticardiolipin negative, beta-2 IgA 23, lupus anticoagulant negative, ANCA negative, cryoglobulins negative, ESR 6, CK 96  Speciality Comments: No specialty comments available.  Procedures:  No procedures performed Allergies: Patient has no known allergies.   Assessment / Plan:     Visit Diagnoses: No diagnosis  found.  Orders: No orders of the defined types were placed in this encounter.  No orders of the defined types were placed in this encounter.   Face-to-face time spent with patient was *** minutes. Greater than 50% of time was spent in counseling and coordination of care.  Follow-Up Instructions: No follow-ups on file.   Bo Merino, MD  Note - This record has been created using Editor, commissioning.  Chart creation errors have been sought, but may not always  have been located. Such creation errors do not reflect on  the standard of medical care.

## 2019-07-22 ENCOUNTER — Ambulatory Visit: Payer: Medicare HMO

## 2019-07-22 ENCOUNTER — Other Ambulatory Visit: Payer: Self-pay | Admitting: Cardiology

## 2019-07-22 ENCOUNTER — Encounter: Payer: Self-pay | Admitting: Cardiology

## 2019-07-22 DIAGNOSIS — Z20822 Contact with and (suspected) exposure to covid-19: Secondary | ICD-10-CM | POA: Diagnosis not present

## 2019-07-22 NOTE — Telephone Encounter (Signed)
PSA normal no need to refer to urology

## 2019-07-22 NOTE — Telephone Encounter (Signed)
Patient states he understands that his PSA was normal. He still has urination issues. He is urinating every 2 hours and states it takes a long time for the urine to come out. He would still like to be referred to urology.

## 2019-07-23 ENCOUNTER — Telehealth: Payer: Self-pay | Admitting: Rheumatology

## 2019-07-23 ENCOUNTER — Telehealth (INDEPENDENT_AMBULATORY_CARE_PROVIDER_SITE_OTHER): Payer: Medicare HMO | Admitting: Rheumatology

## 2019-07-23 ENCOUNTER — Other Ambulatory Visit (INDEPENDENT_AMBULATORY_CARE_PROVIDER_SITE_OTHER): Payer: Self-pay | Admitting: Primary Care

## 2019-07-23 ENCOUNTER — Encounter: Payer: Self-pay | Admitting: Rheumatology

## 2019-07-23 ENCOUNTER — Other Ambulatory Visit: Payer: Self-pay

## 2019-07-23 ENCOUNTER — Telehealth (INDEPENDENT_AMBULATORY_CARE_PROVIDER_SITE_OTHER): Payer: Self-pay

## 2019-07-23 DIAGNOSIS — M79642 Pain in left hand: Secondary | ICD-10-CM | POA: Diagnosis not present

## 2019-07-23 DIAGNOSIS — E119 Type 2 diabetes mellitus without complications: Secondary | ICD-10-CM | POA: Diagnosis not present

## 2019-07-23 DIAGNOSIS — R351 Nocturia: Secondary | ICD-10-CM

## 2019-07-23 DIAGNOSIS — M79641 Pain in right hand: Secondary | ICD-10-CM | POA: Diagnosis not present

## 2019-07-23 DIAGNOSIS — I7300 Raynaud's syndrome without gangrene: Secondary | ICD-10-CM

## 2019-07-23 DIAGNOSIS — M1710 Unilateral primary osteoarthritis, unspecified knee: Secondary | ICD-10-CM | POA: Diagnosis not present

## 2019-07-23 DIAGNOSIS — Z794 Long term (current) use of insulin: Secondary | ICD-10-CM | POA: Diagnosis not present

## 2019-07-23 LAB — NOVEL CORONAVIRUS, NAA: SARS-CoV-2, NAA: DETECTED — AB

## 2019-07-23 NOTE — Telephone Encounter (Signed)
Patient called to state that he had an appointment with his rheumatologist today and she advice him to ask PCP to place a referral to see a cardiologist. Patient states that his rheumatologist advice that his blood circulation was not good and need to be seen by a cardiologist.   Please advice [phone]  Thank you

## 2019-07-23 NOTE — Telephone Encounter (Signed)
I called patient to prescreen for his appointment tomorrow 07/24/19. Patient informed me he was tested for the [facility] Virus just yesterday 07/22/19. Patient claims he was tested just to be tested before he gets the COVID vaccine, but orders for test states to r/o illness, and patient presented with systems. I told patient we would have to reschedule his appointment for 4 weeks out test. Patient was irate with this policy, and demanded to know who made that policy. After informing patient Dr. Corliss Skains makes these policies he wanted to know who she that she was to make such a fake, and unfair rule. Patient stated doctor is not the health department, and has no right to make such a rule. " Is she a super natural human being, and can be more careful than anyone else in the world ?, per patient.  Patient also went on to say he has a daughter, and son that are doctors along with 10 other family members that are doctors, and he has never heard of such a ridiculous rule. "if she cared about me at all, she would not make such a crazy rule".   Patient requested I send back a message to doctor word for word what he said. Patient stated "she will call me back if she has any courage at all, and if she does not I will not hear from her." Patient was rescheduled to 08/19/19.

## 2019-07-23 NOTE — Telephone Encounter (Signed)
Please read office note from rheumatologist. Place referral if appropriate.

## 2019-07-23 NOTE — Progress Notes (Signed)
Virtual Visit via Telephone Note  I connected with Jordan Bryant on 07/23/19 at  3:30 PM EST by telephone and verified that I am speaking with the correct person using two identifiers.  Location: Patient: Home  Provider: Clinic This service was conducted via virtual visit.  The patient was located at home. I was located in my office.  Consent was obtained prior to the virtual visit and is aware of possible charges through their insurance for this visit.  The patient is an established patient.  Dr. Estanislado Pandy, MD conducted the virtual visit and Hazel Sams, PA-C acted as scribe during the service.  Office staff helped with scheduling follow up visits after the service was conducted.    I discussed the limitations, risks, security and privacy concerns of performing an evaluation and management service by telephone and the availability of in person appointments. I also discussed with the patient that there may be a patient responsible charge related to this service. The patient expressed understanding and agreed to proceed.   CC: Discuss lab work History of Present Illness: Patient is a 64 year old male with a past medical history of raynaud's and osteoarthritis. He continues to experience intermittent symptoms of Raynaud's.  He denies any digital ulcerations.  He states his toes get very cold in the evening, so he wears socks at bedtime and wakes up and his feet are very hot. He denies any other concerns at this time.   Review of Systems  Constitutional: Negative for fever and malaise/fatigue.  HENT:       Negative for oral ulcers, nasal ulcers, dry mouth or dry eyes.  Eyes: Negative for photophobia, pain, discharge and redness.  Respiratory: Negative for cough, shortness of breath and wheezing.   Cardiovascular: Negative for chest pain and palpitations.       Cold hands and feet  Gastrointestinal: Negative for blood in stool, constipation and diarrhea.  Genitourinary: Negative for dysuria.   Musculoskeletal: Negative for back pain, joint pain, myalgias and neck pain.       Denies joint swelling.  Skin: Negative for rash.       +Raynaud's, denies any rash.  Neurological: Negative for dizziness and headaches.  Psychiatric/Behavioral: Negative for depression. The patient is not nervous/anxious and does not have insomnia.       Observations/Objective: Physical Exam  Constitutional: He is oriented to person, place, and time.  Neurological: He is alert and oriented to person, place, and time.  Psychiatric: Mood, memory, affect and judgment normal.   Patient reports morning stiffness for 0  minutes.   Patient denies nocturnal pain.  Difficulty dressing/grooming: Denies Difficulty climbing stairs: Denies Difficulty getting out of chair: Denies Difficulty using hands for taps, buttons, cutlery, and/or writing: Denies   Assessment and Plan: Raynaud's disease without gangrene: ANA 1: 80 nuclear, speckled, ENA negative, ESR 6, CK 96, complements WNL, cryoglobulin not detected, lupus anticoagulant not detected, Beta-2 IgA 23 which is not a significant titer: We reviewed the lab work obtained on 07/04/19 and all questions were addressed.  He continues to have intermittent symptoms of Raynaud's.  He has not had any digital ulcerations or signs of gangrene.  After reviewing his medication list, he was encouraged to schedule a follow up visit with his cardiologist to discuss alternatives to taking metoprolol.  It is not clear why he is taking metoprolol but amlodipine may be a better options.  We discussed that beta blockers actually worsen symptoms of Raynaud's by causing vasoconstriction.  We discussed  the importance of keeping his core body temperature warm, drinking warm fluids, and wearing gloves and thick socks.  He was also advised to avoid exposure to cigarette smoke.  He was advised to notify us if he develops any new or worsening symptoms.  He will follow up 2 months.  I did not get  the opportunity to examine the patient in person so far.  Pain in both hands-Secondary to Raynaud's.  No joint swelling per patient  Patellofemoral arthritis both knees-He has occasional pain in both knee joints but no joint swelling per patient.   Type 2 diabetes mellitus without complication, with long-term current use of insulin ([place])  COVID-19 positive-patient was tested yesterday and his Covid test came positive.  He states the symptoms were worse last week which are gradually improving.   Follow Up Instructions: He will follow up 3 months.   I discussed the assessment and treatment plan with the patient. The patient was provided an opportunity to ask questions and all were answered. The patient agreed with the plan and demonstrated an understanding of the instructions.   The patient was advised to call back or seek an in-person evaluation if the symptoms worsen or if the condition fails to improve as anticipated.  I provided 30 minutes of non-face-to-face time during this encounter.   Bo Merino, MD   Scribed by-  Hazel Sams, PA-C

## 2019-07-23 NOTE — Telephone Encounter (Signed)
FYI - Patient is scheduled for virtual phone visit today at 1:30.

## 2019-07-24 ENCOUNTER — Ambulatory Visit: Payer: Medicare HMO | Admitting: Rheumatology

## 2019-07-24 ENCOUNTER — Ambulatory Visit (HOSPITAL_COMMUNITY)
Admission: RE | Admit: 2019-07-24 | Discharge: 2019-07-24 | Disposition: A | Discharge status: Home / Self Care | Payer: Medicare Other | Source: Ambulatory Visit | Attending: Pulmonary Disease | Admitting: Pulmonary Disease

## 2019-07-24 ENCOUNTER — Other Ambulatory Visit: Payer: Self-pay | Admitting: Internal Medicine

## 2019-07-24 DIAGNOSIS — U071 COVID-19: Secondary | ICD-10-CM | POA: Diagnosis present

## 2019-07-24 DIAGNOSIS — Z794 Long term (current) use of insulin: Secondary | ICD-10-CM

## 2019-07-24 DIAGNOSIS — E119 Type 2 diabetes mellitus without complications: Secondary | ICD-10-CM | POA: Insufficient documentation

## 2019-07-24 DIAGNOSIS — Z23 Encounter for immunization: Secondary | ICD-10-CM | POA: Diagnosis not present

## 2019-07-24 MED ORDER — SODIUM CHLORIDE 0.9 % IV SOLN
700.0000 mg | Freq: Once | INTRAVENOUS | Status: AC
  Administered 2019-07-24: 700 mg via INTRAVENOUS
  Filled 2019-07-24: qty 20

## 2019-07-24 MED ORDER — SODIUM CHLORIDE 0.9 % IV SOLN
INTRAVENOUS | Status: DC | PRN
  Administered 2019-07-24: 250 mL via INTRAVENOUS

## 2019-07-24 MED ORDER — ALBUTEROL SULFATE HFA 108 (90 BASE) MCG/ACT IN AERS: 2.0000 | INHALATION_SPRAY | Freq: Once | RESPIRATORY_TRACT | Status: DC | PRN

## 2019-07-24 MED ORDER — FAMOTIDINE IN NACL 20-0.9 MG/50ML-% IV SOLN: 20.0000 mg | Freq: Once | INTRAVENOUS | Status: DC | PRN

## 2019-07-24 MED ORDER — DIPHENHYDRAMINE HCL 50 MG/ML IJ SOLN: 50.0000 mg | Freq: Once | INTRAMUSCULAR | Status: DC | PRN

## 2019-07-24 MED ORDER — EPINEPHRINE 0.3 MG/0.3ML IJ SOAJ: 0.3000 mg | Freq: Once | INTRAMUSCULAR | Status: DC | PRN

## 2019-07-24 MED ORDER — METHYLPREDNISOLONE SODIUM SUCC 125 MG IJ SOLR: 125.0000 mg | Freq: Once | INTRAMUSCULAR | Status: DC | PRN

## 2019-07-24 NOTE — Progress Notes (Signed)
  Diagnosis: COVID-19  Physician: Dr. Wright  Procedure: Covid Infusion Clinic Med: bamlanivimab infusion - Provided patient with bamlanimivab fact sheet for patients, parents and caregivers prior to infusion.  Complications: No immediate complications noted.  Discharge: Discharged home   Alaynna Kerwood A 07/24/2019  

## 2019-07-24 NOTE — Progress Notes (Signed)
  I connected by phone with Jordan Bryant on 07/24/2019 at 11:47 AM to discuss the potential use of an new treatment for mild to moderate COVID-19 viral infection in non-hospitalized patients.  This patient is a 64 y.o. male that meets the FDA criteria for Emergency Use Authorization of bamlanivimab or casirivimab\imdevimab.  Has a (+) direct SARS-CoV-2 viral test result  Has mild or moderate COVID-19   Is ? 66 years of age and weighs ? 40 kg  Is NOT hospitalized due to COVID-19  Is NOT requiring oxygen therapy or requiring an increase in baseline oxygen flow rate due to COVID-19  Is within 10 days of symptom onset  Has at least one of the high risk factor(s) for progression to severe COVID-19 and/or hospitalization as defined in EUA.  Specific high risk criteria : >/= 66 yo   I have spoken and communicated the following to the patient or parent/caregiver:  1. FDA has authorized the emergency use of bamlanivimab and casirivimab\imdevimab for the treatment of mild to moderate COVID-19 in adults and pediatric patients with positive results of direct SARS-CoV-2 viral testing who are 50 years of age and older weighing at least 40 kg, and who are at high risk for progressing to severe COVID-19 and/or hospitalization.  2. The significant known and potential risks and benefits of bamlanivimab and casirivimab\imdevimab, and the extent to which such potential risks and benefits are unknown.  3. Information on available alternative treatments and the risks and benefits of those alternatives, including clinical trials.  4. Patients treated with bamlanivimab and casirivimab\imdevimab should continue to self-isolate and use infection control measures (e.g., wear mask, isolate, social distance, avoid sharing personal items, clean and disinfect "high touch" surfaces, and frequent handwashing) according to CDC guidelines.   5. The patient or parent/caregiver has the option to accept or refuse  bamlanivimab or casirivimab\imdevimab .  After reviewing this information with the patient, The patient agreed to proceed with receiving the bamlanimivab infusion and will be provided a copy of the Fact sheet prior to receiving the infusion.   Day 5 of symptoms. Infusion scheduled 2/10 at 1630  Cyndee Brightly, NP-C [facility]

## 2019-07-24 NOTE — Discharge Instructions (Signed)

## 2019-07-26 ENCOUNTER — Other Ambulatory Visit (INDEPENDENT_AMBULATORY_CARE_PROVIDER_SITE_OTHER): Payer: Self-pay | Admitting: Primary Care

## 2019-07-26 MED ORDER — LISINOPRIL 10 MG PO TABS: 10.0000 mg | ORAL_TABLET | Freq: Every day | ORAL | 1 refills | Status: DC

## 2019-07-26 MED FILL — LISINOPRIL 10 MG TABS: 10 | 30 days supply | Qty: 30 | Fill #0

## 2019-07-26 NOTE — Telephone Encounter (Signed)
D/C metoprolol changed to lisinopril for HTN and ADA guideline. Have patient to take Bp at home and f/u Bp tele in 4-6 weeks

## 2019-07-26 NOTE — Telephone Encounter (Signed)
Patient aware to stop taking metoprolol and pick up lisinopril. Advised him to check bp at home. Once he picks up new Rx he will call clinic and schedule 6 week follow up for hypertension.

## 2019-07-29 ENCOUNTER — Telehealth (INDEPENDENT_AMBULATORY_CARE_PROVIDER_SITE_OTHER): Payer: Self-pay

## 2019-07-29 DIAGNOSIS — R69 Illness, unspecified: Secondary | ICD-10-CM | POA: Diagnosis not present

## 2019-07-29 MED FILL — ONE TOUCH VERIO TEST STRIP: 30 days supply | Qty: 100 | Fill #1

## 2019-07-29 MED FILL — ONETOUCH DELICA PLUS LANCET: 30 days supply | Qty: 100 | Fill #1

## 2019-07-29 MED FILL — ROSUVASTATIN CALCIUM 10 MG: 10 | 30 days supply | Qty: 30 | Fill #1

## 2019-07-29 MED FILL — metFORMIN HCL 1000 MG TABS: 1000 | 90 days supply | Qty: 180 | Fill #0

## 2019-07-29 NOTE — Telephone Encounter (Signed)
Patient called to report that he is taking lisinopril 10 mg and feels the dosage is to high. Patient states he took lisinopril 5 mg before.patient states his blood pressure is low and can only remember the systolic number which was 105. Patient checked his blood pressure today at 4 pm with out taking his medication and it was 156/101.   Please advice patient [phone]

## 2019-07-29 NOTE — Telephone Encounter (Signed)
Fwd to PCP

## 2019-07-30 NOTE — Telephone Encounter (Signed)
Blood pressure is too high without taking lisinopril 10mg . If Bp is monitoring low on 10mg  take only half a pill and keep a log of Bp and follow up with tele Bp check in 3 weeks

## 2019-07-31 NOTE — Telephone Encounter (Signed)
Left voicemail notifying patient of PCP advise to do the following. Blood pressure is too high without taking lisinopril 10mg . If Bp is monitoring low on 10mg  take only half a pill and keep a log of Bp and follow up with tele Bp check in 3 weeks. Return call to [facility] at [phone] with any questions or concerns and to schedule Bp check . , CMA

## 2019-08-22 ENCOUNTER — Ambulatory Visit: Payer: Medicare HMO | Admitting: Rheumatology

## 2019-08-23 ENCOUNTER — Telehealth (INDEPENDENT_AMBULATORY_CARE_PROVIDER_SITE_OTHER): Payer: Self-pay

## 2019-08-23 DIAGNOSIS — R69 Illness, unspecified: Secondary | ICD-10-CM | POA: Diagnosis not present

## 2019-08-23 MED FILL — ONE TOUCH VERIO TEST STRIP: 30 days supply | Qty: 100 | Fill #2

## 2019-08-23 MED FILL — ONETOUCH DELICA PLUS LANCET: 30 days supply | Qty: 100 | Fill #2

## 2019-08-23 MED FILL — LISINOPRIL 5 MG TABLET: 5 | 30 days supply | Qty: 30 | Fill #0

## 2019-08-23 MED FILL — ROSUVASTATIN CALCIUM 10 MG: 10 | 30 days supply | Qty: 30 | Fill #2

## 2019-08-23 NOTE — Telephone Encounter (Signed)
Patient is needing referral to heart doctor.

## 2019-08-23 NOTE — Telephone Encounter (Signed)
Patient called wanting to know the status of his cardiology referral. Patient states he is taking lisinopril and his Blood pressure has been normal. Patient would like to see cardiologist to see if they can do a scan to see his blood flow. Patient states his hands and feet get very warm and then gets very cold.   Please advice [phone]

## 2019-08-24 DIAGNOSIS — R531 Weakness: Secondary | ICD-10-CM | POA: Diagnosis not present

## 2019-08-24 DIAGNOSIS — R001 Bradycardia, unspecified: Secondary | ICD-10-CM | POA: Diagnosis not present

## 2019-08-24 DIAGNOSIS — R410 Disorientation, unspecified: Secondary | ICD-10-CM | POA: Diagnosis not present

## 2019-08-24 DIAGNOSIS — R0902 Hypoxemia: Secondary | ICD-10-CM | POA: Diagnosis not present

## 2019-08-25 ENCOUNTER — Emergency Department (HOSPITAL_COMMUNITY): Payer: Medicare HMO

## 2019-08-25 ENCOUNTER — Other Ambulatory Visit: Payer: Self-pay

## 2019-08-25 ENCOUNTER — Emergency Department (HOSPITAL_COMMUNITY)
Admission: EM | Admit: 2019-08-25 | Discharge: 2019-08-25 | Disposition: A | Discharge status: Home / Self Care | Payer: Medicare HMO | Attending: Emergency Medicine | Admitting: Emergency Medicine

## 2019-08-25 DIAGNOSIS — E1165 Type 2 diabetes mellitus with hyperglycemia: Secondary | ICD-10-CM | POA: Insufficient documentation

## 2019-08-25 DIAGNOSIS — R112 Nausea with vomiting, unspecified: Secondary | ICD-10-CM | POA: Insufficient documentation

## 2019-08-25 DIAGNOSIS — R4182 Altered mental status, unspecified: Secondary | ICD-10-CM | POA: Diagnosis not present

## 2019-08-25 DIAGNOSIS — Z794 Long term (current) use of insulin: Secondary | ICD-10-CM | POA: Diagnosis not present

## 2019-08-25 DIAGNOSIS — I2510 Atherosclerotic heart disease of native coronary artery without angina pectoris: Secondary | ICD-10-CM | POA: Insufficient documentation

## 2019-08-25 DIAGNOSIS — R739 Hyperglycemia, unspecified: Secondary | ICD-10-CM

## 2019-08-25 DIAGNOSIS — R531 Weakness: Secondary | ICD-10-CM | POA: Diagnosis not present

## 2019-08-25 DIAGNOSIS — R55 Syncope and collapse: Secondary | ICD-10-CM | POA: Diagnosis not present

## 2019-08-25 DIAGNOSIS — Z20822 Contact with and (suspected) exposure to covid-19: Secondary | ICD-10-CM | POA: Diagnosis not present

## 2019-08-25 DIAGNOSIS — R29818 Other symptoms and signs involving the nervous system: Secondary | ICD-10-CM | POA: Diagnosis not present

## 2019-08-25 DIAGNOSIS — I639 Cerebral infarction, unspecified: Secondary | ICD-10-CM | POA: Diagnosis not present

## 2019-08-25 DIAGNOSIS — E860 Dehydration: Secondary | ICD-10-CM | POA: Diagnosis not present

## 2019-08-25 LAB — COMPREHENSIVE METABOLIC PANEL
ALT: 18 U/L (ref 0–44)
AST: 26 U/L (ref 15–41)
Albumin: 3.8 g/dL (ref 3.5–5.0)
Alkaline Phosphatase: 44 U/L (ref 38–126)
Anion gap: 10 (ref 5–15)
BUN: 18 mg/dL (ref 8–23)
CO2: 20 mmol/L — ABNORMAL LOW (ref 22–32)
Calcium: 8.4 mg/dL — ABNORMAL LOW (ref 8.9–10.3)
Chloride: 105 mmol/L (ref 98–111)
Creatinine, Ser: 1.02 mg/dL (ref 0.61–1.24)
GFR calc Af Amer: >60 mL/min (ref >60)
GFR calc non Af Amer: >60 mL/min (ref >60)
Glucose, Bld: 253 mg/dL — ABNORMAL HIGH (ref 70–99)
Potassium: 3.7 mmol/L (ref 3.5–5.1)
Sodium: 135 mmol/L (ref 135–145)
Total Bilirubin: 0.5 mg/dL (ref 0.3–1.2)
Total Protein: 6.6 g/dL (ref 6.5–8.1)

## 2019-08-25 LAB — URINALYSIS, ROUTINE W REFLEX MICROSCOPIC
Bilirubin Urine: NEGATIVE
Glucose, UA: 150 mg/dL — AB
Hgb urine dipstick: NEGATIVE
Ketones, ur: 5 mg/dL — AB
Leukocytes,Ua: NEGATIVE
Nitrite: NEGATIVE
Protein, ur: NEGATIVE mg/dL
Specific Gravity, Urine: >1.046 — ABNORMAL HIGH (ref 1.005–1.030)
pH: 6 (ref 5.0–8.0)

## 2019-08-25 LAB — I-STAT CHEM 8, ED
BUN: 19 mg/dL (ref 8–23)
Calcium, Ion: 1.09 mmol/L — ABNORMAL LOW (ref 1.15–1.40)
Chloride: 104 mmol/L (ref 98–111)
Creatinine, Ser: 0.9 mg/dL (ref 0.61–1.24)
Glucose, Bld: 244 mg/dL — ABNORMAL HIGH (ref 70–99)
HCT: 37 % — ABNORMAL LOW (ref 39.0–52.0)
Hemoglobin: 12.6 g/dL — ABNORMAL LOW (ref 13.0–17.0)
Potassium: 3.6 mmol/L (ref 3.5–5.1)
Sodium: 139 mmol/L (ref 135–145)
TCO2: 23 mmol/L (ref 22–32)

## 2019-08-25 LAB — CBC
HCT: 39.2 % (ref 39.0–52.0)
Hemoglobin: 12.9 g/dL — ABNORMAL LOW (ref 13.0–17.0)
MCH: 27.2 pg (ref 26.0–34.0)
MCHC: 32.9 g/dL (ref 30.0–36.0)
MCV: 82.7 fL (ref 80.0–100.0)
Platelets: 154 10*3/uL (ref 150–400)
RBC: 4.74 MIL/uL (ref 4.22–5.81)
RDW: 12.8 % (ref 11.5–15.5)
WBC: 8.6 10*3/uL (ref 4.0–10.5)
nRBC: 0 % (ref 0.0–0.2)

## 2019-08-25 LAB — DIFFERENTIAL
Abs Immature Granulocytes: 0.02 10*3/uL (ref 0.00–0.07)
Basophils Absolute: 0.1 10*3/uL (ref 0.0–0.1)
Basophils Relative: 1 %
Eosinophils Absolute: 0.3 10*3/uL (ref 0.0–0.5)
Eosinophils Relative: 4 %
Immature Granulocytes: 0 %
Lymphocytes Relative: 44 %
Lymphs Abs: 3.8 10*3/uL (ref 0.7–4.0)
Monocytes Absolute: 0.8 10*3/uL (ref 0.1–1.0)
Monocytes Relative: 9 %
Neutro Abs: 3.6 10*3/uL (ref 1.7–7.7)
Neutrophils Relative %: 42 %

## 2019-08-25 LAB — APTT: aPTT: 27 seconds (ref 24–36)

## 2019-08-25 LAB — PROTIME-INR
INR: 0.9 (ref 0.8–1.2)
Prothrombin Time: 12.3 seconds (ref 11.4–15.2)

## 2019-08-25 LAB — SARS CORONAVIRUS 2 (TAT 6-24 HRS): SARS Coronavirus 2: NEGATIVE

## 2019-08-25 LAB — TROPONIN I (HIGH SENSITIVITY)
Troponin I (High Sensitivity): 2 ng/L (ref <18)
Troponin I (High Sensitivity): <2 ng/L (ref <18)

## 2019-08-25 LAB — CBG MONITORING, ED: Glucose-Capillary: 222 mg/dL — ABNORMAL HIGH (ref 70–99)

## 2019-08-25 MED ORDER — SODIUM CHLORIDE 0.9% FLUSH
3.0000 mL | Freq: Once | INTRAVENOUS | Status: AC
  Administered 2019-08-25: 02:00:00 3 mL via INTRAVENOUS

## 2019-08-25 MED ORDER — IOHEXOL 350 MG/ML SOLN
115.0000 mL | Freq: Once | INTRAVENOUS | Status: AC | PRN
  Administered 2019-08-25: 01:00:00 115 mL via INTRAVENOUS

## 2019-08-25 MED ORDER — ONDANSETRON 4 MG PO TBDP: ORAL_TABLET | ORAL | 0 refills | Status: DC

## 2019-08-25 MED ORDER — LACTATED RINGERS IV BOLUS
1000.0000 mL | Freq: Once | INTRAVENOUS | Status: AC
  Administered 2019-08-25: 02:00:00 1000 mL via INTRAVENOUS

## 2019-08-25 NOTE — ED Notes (Signed)
Cancelled Code Stoke per Dr Mikey Kirschner

## 2019-08-25 NOTE — ED Provider Notes (Signed)
Emergency Department Provider Note   I have reviewed the triage vital signs and the nursing notes.   HISTORY  Chief Complaint No chief complaint on file.   HPI Jordan Bryant is a 64 y.o. male who presents the emergency department as a code stroke secondary to a brief episode of not acting like himself associated with generalized weakness and not feeling well.  He had been told he had a couple TIAs in the past but there is no evidence that that is the case.  Had no demonstrable focal weakness.  Brought here for further evaluation.   No other associated or modifying symptoms.    Past Medical History:  Diagnosis Date  . Asthma   . Coronary artery disease   . Diabetes mellitus without complication (HCC)   . Kidney stone   . Stroke [facility])     Patient Active Problem List   Diagnosis Date Noted  . Patellofemoral arthritis both knees 06/27/2019  . Type 2 diabetes mellitus without complication, with long-term current use of insulin (HCC) 11/22/2016    Past Surgical History:  Procedure Laterality Date  . Kidney Stones    . PROSTATE SURGERY      Current Outpatient Rx  . Order #: [id number] Class: Historical Med  . Order #: [id number] Class: Print  . Order #: [id number] Class: Normal  . Order #: [id number] Class: Normal  . Order #: [id number] Class: Print  . Order #: [id number] Class: Normal  . Order #: [id number] Class: Print  . Order #: [id number] Class: Normal  . Order #: [id number] Class: Normal  . Order #: [id number] Class: Print  . Order #: [id number] Class: Print    Allergies Patient has no known allergies.  Family History  Problem Relation Age of Onset  . Heart attack Mother   . Heart attack Father   . Colon cancer Neg Hx   . Esophageal cancer Neg Hx   . Liver cancer Neg Hx   . Pancreatic cancer Neg Hx   . Rectal cancer Neg Hx   . Stomach cancer Neg Hx     Social History Social History   Tobacco Use  . Smoking status: Never Smoker  . Smokeless tobacco: Never Used    Substance Use Topics  . Alcohol use: No  . Drug use: No    Review of Systems  All other systems negative except as documented in the HPI. All pertinent positives and negatives as reviewed in the HPI. ____________________________________________   PHYSICAL EXAM:  VITAL SIGNS: Vitals:   08/25/19 0545 08/25/19 0600 08/25/19 0615 08/25/19 0630  BP: 120/77 120/74 112/69 117/77  Pulse: 97 100 (!) 103 96  Resp: 19 15 18 16   Temp:      TempSrc:      SpO2: 96% 94% 98% 96%  Weight:        Constitutional: Alert and oriented. Well appearing and in no acute distress. Eyes: Conjunctivae are normal. PERRL. EOMI. Head: Atraumatic. Nose: No congestion/rhinnorhea. Mouth/Throat: Mucous membranes are moist.  Oropharynx non-erythematous. Neck: No stridor.  No meningeal signs.   Cardiovascular: Normal rate, regular rhythm. Good peripheral circulation. Grossly normal heart sounds.   Respiratory: Normal respiratory effort.  No retractions. Lungs CTAB. Gastrointestinal: Soft and nontender. No distention.  Musculoskeletal: No lower extremity tenderness nor edema. No gross deformities of extremities. Neurologic:  Normal speech and language. No gross focal neurologic deficits are appreciated.  Skin:  Skin is warm, dry and intact. No rash noted.   ____________________________________________   LABS (all labs ordered are listed, but only abnormal results  are displayed)  Labs Reviewed  CBG MONITORING, ED - Abnormal; Notable for the following components:      Result Value   Glucose-Capillary 222 (*)    All other components within normal limits  PROTIME-INR  APTT  CBC  DIFFERENTIAL  COMPREHENSIVE METABOLIC PANEL  I-STAT CHEM 8, ED  CBG MONITORING, ED   ____________________________________________  EKG   EKG Interpretation  Date/Time:    Ventricular Rate:    PR Interval:    QRS Duration:   QT Interval:    QTC Calculation:   R Axis:     Text Interpretation:          ____________________________________________  RADIOLOGY  No results found.  ____________________________________________   PROCEDURES  Procedure(s) performed:   Procedures   ____________________________________________   INITIAL IMPRESSION / ASSESSMENT AND PLAN / ED COURSE  Your generalized weakness likely related to dehydration which may be from his hyperglycemia.  No evidence of infections.  No evidence of other acute causes for symptoms.  Ruled out for stroke by neurology.  Patient was hydrated his vital signs improved.  He is tolerating p.o.  Patient is felt to be stable for discharge with follow-up with his PCP for further management.     Pertinent labs & imaging results that were available during my care of the patient were reviewed by me and considered in my medical decision making (see chart for details).   A medical screening exam was performed and I feel the patient has had an appropriate workup for their chief complaint at this time and likelihood of emergent condition existing is low. They have been counseled on decision, discharge, follow up and which symptoms necessitate immediate return to the emergency department. They or their family verbally stated understanding and agreement with plan and discharged in stable condition.   ____________________________________________  FINAL CLINICAL IMPRESSION(S) / ED DIAGNOSES  Final diagnoses:  None     MEDICATIONS GIVEN DURING THIS VISIT:  Medications  sodium chloride flush (NS) 0.9 % injection 3 mL (has no administration in time range)     NEW OUTPATIENT MEDICATIONS STARTED DURING THIS VISIT:  New Prescriptions   No medications on file    Note:  This note was prepared with assistance of Dragon voice recognition software. Occasional wrong-word or sound-a-like substitutions may have occurred due to the inherent limitations of voice recognition software.   Rhyder Bratz, Corene Cornea, MD 08/26/19 [phone]

## 2019-08-25 NOTE — ED Notes (Signed)
Pt O2 saturation dropped to 88% on room air in CT. Pt placed on 2L [place]. O2 saturation improved to 94%

## 2019-08-25 NOTE — ED Notes (Signed)
Discharge instructions reviewed with pt and pt son. Pt and son verbalized understanding.   

## 2019-08-25 NOTE — ED Triage Notes (Signed)
Pt arrives via EMS from home where family called out for altered mental status. Family reported pt LKW was 2300 tonight. EMS reports two episodes of emesis and right sided arm weakness.

## 2019-08-25 NOTE — Consult Note (Addendum)
Neurology Consultation  Reason for Consult: Code stroke for altered mental status Referring Physician: Dr. Dolly Rias  CC: Decreased level of consciousness, vomiting  History is obtained from: Chart review, son and later on the patient  HPI: Jordan Bryant is a 64 y.o. male who has a past medical history of asthma, coronary artery disease, diabetes, prior history of abnormal spells labeled as TIAs with unclear focality, presenting to the emergency room as an acute code stroke due to decreased level of consciousness and vomiting. According to the son, he was last normal around to the best of history taking at 9 PM on 08/24/2019 when he suddenly started to complain of not feeling well.  His level of consciousness was lower and he asked the family to call 911.  911 came and assessed the patient and brought him in as an acute code stroke because according to them at scene he was possibly weaker on the right side and had depressed level of consciousness. Sugars were in the 200s.  Blood pressures were in the 962I to 297 systolic at the scene. He was evaluated emergently as a code stroke in the [facility] emergency room ER [place].  He was unable to answer all questions verbally but was able to nod yes and no at the time.  Exam was nonfocal-see detailed exam below. Stat head CT negative for acute process. Stat CTA and CT perfusion head unremarkable for any large vessel occlusion. CT perfusion study unremarkable for any perfusion deficit. Taken for stat MRI due to prior episodes that the family was concerned were strokes-1 happened in [place] where he lost consciousness and the second 1 happened when he was traveling in [place], [place] is where he is originally from. He has seen Dr. Leta Baptist at [facility] neurology and was worked up for possible TIAs with MRI and other tests which were all unremarkable. Also has a history of Raynaud's disease without gangrene and follows with rheumatology.  No history  of seizures.  No sick contacts.  Nobody else in the house sick with any GI symptoms.  LKW: 9 PM on 08/24/2019 tpa given?: no, nonfocal symptoms Premorbid modified Rankin scale (mRS): 0  ROS: Unable to reliably ascertain from the patient.  Obtained from son who reports nausea and vomiting preceding this episode.  2 episodes of vomiting after which she felt better than before but did not feel back to baseline.  Past Medical History:  Diagnosis Date  . Asthma   . Coronary artery disease   . Diabetes mellitus without complication ([place])   . Kidney stone   . Stroke [facility])     Family History  Problem Relation Age of Onset  . Heart attack Mother   . Heart attack Father   . Colon cancer Neg Hx   . Esophageal cancer Neg Hx   . Liver cancer Neg Hx   . Pancreatic cancer Neg Hx   . Rectal cancer Neg Hx   . Stomach cancer Neg Hx    Social History:   reports that he has never smoked. He has never used smokeless tobacco. He reports that he does not drink alcohol or use drugs.  Medications  Current Facility-Administered Medications:  .  0.9 %  sodium chloride infusion, 500 mL, Intravenous, Once, Danis, Henry L III, MD .  sodium chloride flush (NS) 0.9 % injection 3 mL, 3 mL, Intravenous, Once, Mesner, Corene Cornea, MD  Current Outpatient Medications:  .  aspirin 81 MG chewable tablet, Chew by mouth daily.,  Disp: , Rfl:  .  Blood Glucose Monitoring Suppl (ONETOUCH VERIO FLEX SYSTEM) w/Device KIT, 1 kit by Does not apply route 3 (three) times daily with meals as needed., Disp: 1 kit, Rfl: 0 .  dapagliflozin propanediol (FARXIGA) 10 MG TABS tablet, Take 10 mg by mouth daily. (Patient taking differently: Take 10 mg by mouth as needed. ), Disp: 30 tablet, Rfl: 2 .  fluocinonide ointment (LIDEX) 6.04 %, APPLY 1 APPLICATION TOPICALLY 2 (TWO) TIMES DAILY., Disp: 30 g, Rfl: 0 .  glipiZIDE (GLUCOTROL) 5 MG tablet, Take 1 tablet (5 mg total) by mouth 2 (two) times daily before a meal., Disp: 120 tablet, Rfl:  3 .  glucose blood (ONETOUCH VERIO) test strip, Use as instructed, Disp: 100 each, Rfl: 12 .  Lancets MISC, 1 each by Does not apply route 3 (three) times daily., Disp: 90 each, Rfl: 2 .  linaclotide (LINZESS) 72 MCG capsule, Take 1 capsule (72 mcg total) by mouth daily before breakfast., Disp: 90 capsule, Rfl: 3 .  lisinopril (ZESTRIL) 10 MG tablet, Take 1 tablet (10 mg total) by mouth daily., Disp: 30 tablet, Rfl: 1 .  metFORMIN (GLUCOPHAGE) 1000 MG tablet, Take 1 tablet (1,000 mg total) by mouth 2 (two) times daily with a meal., Disp: 180 tablet, Rfl: 1 .  rosuvastatin (CRESTOR) 10 MG tablet, Take 1 tablet (10 mg total) by mouth daily., Disp: 90 tablet, Rfl: 3  Exam: Current vital signs: BP 131/77   Pulse 96   Resp 16   Wt 74.8 kg   SpO2 94%   BMI 25.07 kg/m  Vital signs in last 24 hours: Pulse Rate:  [96] 96 (03/14 0046) Resp:  [16] 16 (03/14 0046) BP: (131)/(77) 131/77 (03/14 0046) SpO2:  [94 %] 94 % (03/14 0046) Weight:  [74.8 kg] 74.8 kg (03/14 0046) General: Drowsy, responds to voice in no distress Kept on complaining of being thirsty and asked for water both in [place] and Urdu language HEENT: Normocephalic atraumatic CVS: Regular rate rhythm Respiratory: Clear Neurological exam Drowsy, responds to voice in no distress. Nods yes and no to questions but very hypophonic initially and responding and as time passed along, was able to say more words in sentences properly without any dysarthria No evidence of aphasia although he was difficult to assess. Poor attention concentration Cranial nerves: Pupils equal round react light, extraocular movements intact, visual fields are full, face symmetric, tongue and palate midline. Motor exam: He is diffusely weak in all 4 extremities without any focality is able to wiggle toes and move fingers but unable to keep his arms or legs antigravity bilaterally. Sensory exam: Intact to touch all over Coordination: Difficult to assess due to  above Gait testing deferred at this time NIH stroke scale 1a Level of Conscious.:1  1b LOC Questions: 0 1c LOC Commands: 0 2 Best Gaze: 0 3 Visual: 0 4 Facial Palsy: 0 5a Motor Arm - left: 2 5b Motor Arm - Right: 2 6a Motor Leg - Left: 2 6b Motor Leg - Right: 2 7 Limb Ataxia: 0 8 Sensory: 0 9 Best Language: 0 10 Dysarthria: 0 11 Extinct. and Inatten.: 0 TOTAL: 8 -nonfocal   Labs I have reviewed labs in epic and the results pertinent to this consultation are:  CBC    Component Value Date/Time   WBC 8.6 08/25/2019 0011   RBC 4.74 08/25/2019 0011   HGB 12.6 (L) 08/25/2019 0030   HGB 15.4 08/23/2017 1421   HCT 37.0 (L) 08/25/2019 0030  HCT 45.3 08/23/2017 1421   PLT 154 08/25/2019 0011   PLT 201 08/23/2017 1421   MCV 82.7 08/25/2019 0011   MCV 82 08/23/2017 1421   MCH 27.2 08/25/2019 0011   MCHC 32.9 08/25/2019 0011   RDW 12.8 08/25/2019 0011   RDW 13.8 08/23/2017 1421   LYMPHSABS 3.8 08/25/2019 0011   LYMPHSABS 2.5 08/23/2017 1421   MONOABS 0.8 08/25/2019 0011   EOSABS 0.3 08/25/2019 0011   EOSABS 0.3 08/23/2017 1421   BASOSABS 0.1 08/25/2019 0011   BASOSABS 0.1 08/23/2017 1421    CMP     Component Value Date/Time   NA 139 08/25/2019 0030   NA 138 03/14/2019 0941   K 3.6 08/25/2019 0030   CL 104 08/25/2019 0030   CO2 19 (L) 03/14/2019 0941   GLUCOSE 244 (H) 08/25/2019 0030   BUN 19 08/25/2019 0030   BUN 15 03/14/2019 0941   CREATININE 0.90 08/25/2019 0030   CALCIUM 9.0 03/14/2019 0941   PROT 7.0 03/14/2019 0941   ALBUMIN 4.5 03/14/2019 0941   AST 14 03/14/2019 0941   ALT 6 03/14/2019 0941   ALKPHOS 65 03/14/2019 0941   BILITOT 0.5 03/14/2019 0941   GFRNONAA 76 03/14/2019 0941   GFRAA 88 03/14/2019 0941   Imaging I have reviewed the images obtained:  CT head no acute changes CTA no emergent LVO in the head or neck. 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  Assessment: 64 year old man with above past medical history presenting for evaluation of multiple episodes of vomiting and decreased level of consciousness.  No history of seizures.  On scene, EMS were concerned for some right-sided weakness but on the examination in the emergency room, no focality to exam. Has had similar episodes in the past-with unclear explanation. No history of seizures and no witnessed seizure activity noted by family. Prior episodes of generalized weakness happened in the setting of hypertensive crisis but this time around he was not that hypertensive. He was hypertensive but not to the point of being in hypertensive emergency as well as hyperglycemic-this episode could be related to this. Unclear if this is a syncopal/vasovagal episode versus syncope -very atypical if it is that. Other etiologies could include underlying seizure activity versus a psychological etiology to these events given the fact that he in spite of being at very depressed level of consciousness was still able to answer all questions and appeared improving without any real interventions in the ER.  Recommendations: Stat MRI of the brain ordered and preliminary review negative for stroke. At this point, I would check his labs and make sure that there is no evidence of UTI or pneumonia or other infectious process or metabolic derangements. If there are any infectious or metabolic processes going on, I would fix those and have him follow-up with outpatient neurology. I have very low suspicion for this being seizures or seizure-like activity and if he follows with outpatient neurology he can get an outpatient EEG. Imaging has been reassuring in the form of CT, CTA head, CTA neck, CT perfusion study of the brain and MRI of the brain done today.  I have discussed my plan in detail with the patient, with his son at bedside and with the ED  provider Dr. Dolly Rias.  Inpatient neurology will be available as needed. Please call with questions.  -- Amie Portland, MD [facility] Pager: [phone] If  7pm to 7am, please call on call as listed on AMION.

## 2019-08-25 NOTE — ED Notes (Signed)
Pt transported to MRI 

## 2019-08-25 NOTE — ED Notes (Signed)
Pt's son at bedside. RN attempted to collect urine. Pt son states he emptied pt urinal and does not want his father in/out cathed.

## 2019-08-27 NOTE — Telephone Encounter (Signed)
Patient is seeing rheumatology for his temperature changes in his hands Raynaud disease. Cardiologist is not warranted

## 2019-08-28 DIAGNOSIS — H2513 Age-related nuclear cataract, bilateral: Secondary | ICD-10-CM | POA: Diagnosis not present

## 2019-08-28 DIAGNOSIS — E119 Type 2 diabetes mellitus without complications: Secondary | ICD-10-CM | POA: Diagnosis not present

## 2019-08-28 DIAGNOSIS — H52203 Unspecified astigmatism, bilateral: Secondary | ICD-10-CM | POA: Diagnosis not present

## 2019-08-28 DIAGNOSIS — H5203 Hypermetropia, bilateral: Secondary | ICD-10-CM | POA: Diagnosis not present

## 2019-08-28 NOTE — Telephone Encounter (Signed)
Patient is aware that cardiology referral is not warranted. Diagnosis has already been established by rheumatologist. Advised patient to reach out to specialist office with concerns about diagnosis and treatment.

## 2019-08-30 ENCOUNTER — Telehealth: Payer: Self-pay | Admitting: Rheumatology

## 2019-08-30 NOTE — Telephone Encounter (Signed)
Spoke with patient and scheduled appointment for 09/04/19.

## 2019-08-30 NOTE — Telephone Encounter (Signed)
Patient left a voicemail stating he is still having the problem with his hands becoming warm and then cold, as well as his foot.  Patient states he spoke with his doctor who told him that "Rheumatology has to confirm where the problem is and that more tests are needed to diagnosis."  Patient is requesting a return call.

## 2019-08-30 NOTE — Progress Notes (Signed)
Office Visit Note  Patient: Jordan Bryant             Date of Birth: 1955-08-12           MRN: [id number]             PCP: Kerin Perna, NP Referring: Kerin Perna, NP Visit Date: 09/04/2019 Occupation: _0 @  Subjective:  Pain in both feet.   History of Present Illness: Gradie Ohm is a 64 y.o. male with history of diabetes mellitus.  He states every night his hands and feet get really cold before he sleeps and then they become very hot.  He has not noticed any discoloration in his hands and feet.  He denies any discoloration in his hands when he is exposed to the colder temperatures like going to the grocery store or in the cold weather.  He states when his feet become hot they cause discomfort.  Activities of Daily Living:  Patient reports morning stiffness for 0 minutes.   Patient Denies nocturnal pain.  Difficulty dressing/grooming: Denies Difficulty climbing stairs: Denies Difficulty getting out of chair: Denies Difficulty using hands for taps, buttons, cutlery, and/or writing: Denies  Review of Systems  Constitutional: Negative for fatigue.  HENT: Negative for mouth sores, mouth dryness and nose dryness.   Eyes: Negative for itching and dryness.  Respiratory: Negative for shortness of breath and difficulty breathing.   Cardiovascular: Negative for chest pain and palpitations.  Gastrointestinal: Positive for constipation. Negative for blood in stool and diarrhea.  Endocrine: Negative for increased urination.  Genitourinary: Negative for difficulty urinating.  Musculoskeletal: Negative for arthralgias, joint pain, joint swelling and morning stiffness.  Skin: Negative for rash and redness.  Allergic/Immunologic: Negative for susceptible to infections.  Neurological: Negative for dizziness, numbness, headaches, memory loss and weakness.  Hematological: Negative for bruising/bleeding tendency.  Psychiatric/Behavioral: Positive for sleep disturbance.  Negative for confusion.    PMFS History:  Patient Active Problem List   Diagnosis Date Noted  . Patellofemoral arthritis both knees 06/27/2019  . Type 2 diabetes mellitus without complication, with long-term current use of insulin ([place]) 11/22/2016    Past Medical History:  Diagnosis Date  . Asthma   . Coronary artery disease   . Diabetes mellitus without complication ([place])   . Kidney stone   . Stroke [facility])     Family History  Problem Relation Age of Onset  . Heart attack Mother   . Heart attack Father   . Colon cancer Neg Hx   . Esophageal cancer Neg Hx   . Liver cancer Neg Hx   . Pancreatic cancer Neg Hx   . Rectal cancer Neg Hx   . Stomach cancer Neg Hx    Past Surgical History:  Procedure Laterality Date  . Kidney Stones    . PROSTATE SURGERY     Social History   Social History Narrative   Lives with wife   Caffeine- one coffee/tea daily   Immunization History  Administered Date(s) Administered  . Influenza,inj,Quad PF,6+ Mos 02/06/2018  . Pneumococcal Conjugate-13 02/01/2017  . Pneumococcal Polysaccharide-23 02/06/2018  . Tdap 02/01/2017     Objective: Vital Signs: BP 114/73 (BP Location: Left Arm, Patient Position: Sitting, Cuff Size: Normal)   Pulse 90   Resp 15   Ht _1  (1.727 m)   Wt 168 lb (76.2 kg)   BMI 25.54 kg/m    Physical Exam Vitals and nursing note reviewed.  Constitutional:      Appearance: He  is well-developed.  HENT:     Head: Normocephalic and atraumatic.  Eyes:     Conjunctiva/sclera: Conjunctivae normal.     Pupils: Pupils are equal, round, and reactive to light.  Cardiovascular:     Rate and Rhythm: Normal rate and regular rhythm.     Heart sounds: Normal heart sounds.  Pulmonary:     Effort: Pulmonary effort is normal.     Breath sounds: Normal breath sounds.  Abdominal:     General: Bowel sounds are normal.     Palpations: Abdomen is soft.  Musculoskeletal:     Cervical back: Normal range of motion and neck supple.   Skin:    General: Skin is warm and dry.     Capillary Refill: Capillary refill takes less than 2 seconds.  Neurological:     Mental Status: He is alert and oriented to person, place, and time.  Psychiatric:        Behavior: Behavior normal.      Musculoskeletal Exam: C-spine thoracic and lumbar spine with good range of motion.  Elbow joints, wrist joints, MCPs, PIPs and DIPs with good range of motion with no synovitis.  Hip joints, knee joints, ankle joints, MTPs and PIPs with good range of motion with no synovitis.  CDAI Exam: CDAI Score: -- Patient Global: --; Provider Global: -- Swollen: --; Tender: -- Joint Exam 09/04/2019   No joint exam has been documented for this visit   There is currently no information documented on the homunculus. Go to the Rheumatology activity and complete the homunculus joint exam.  Investigation: No additional findings.  Imaging: CT Code Stroke CTA Head W/WO contrast  Result Date: 08/25/2019 CLINICAL DATA:  Right-sided weakness EXAM: CT ANGIOGRAPHY HEAD AND NECK CT PERFUSION BRAIN TECHNIQUE: Multidetector CT imaging of the head and neck was performed using the standard protocol during bolus administration of intravenous contrast. Multiplanar CT image reconstructions and MIPs were obtained to evaluate the vascular anatomy. Carotid stenosis measurements (when applicable) are obtained utilizing NASCET criteria, using the distal internal carotid diameter as the denominator. Multiphase CT imaging of the brain was performed following IV bolus contrast injection. Subsequent parametric perfusion maps were calculated using RAPID software. CONTRAST:  16m OMNIPAQUE IOHEXOL 350 MG/ML SOLN COMPARISON:  Head CT 08/25/2019 FINDINGS: CTA NECK FINDINGS SKELETON: There is a sclerotic focus within the right C6 vertebral body. There is severe stenosis of the left C6 neural foramen due to uncovertebral hypertrophy. No bony spinal canal stenosis. OTHER NECK: Normal pharynx,  larynx and major salivary glands. No cervical lymphadenopathy. Unremarkable thyroid gland. UPPER CHEST: No pneumothorax or pleural effusion. No nodules or masses. AORTIC ARCH: There is mild calcific atherosclerosis of the aortic arch. There is no aneurysm, dissection or hemodynamically significant stenosis of the visualized portion of the aorta. Conventional 3 vessel aortic branching pattern. The visualized proximal subclavian arteries are widely patent. RIGHT CAROTID SYSTEM: Normal without aneurysm, dissection or stenosis. LEFT CAROTID SYSTEM: Normal without aneurysm, dissection or stenosis. VERTEBRAL ARTERIES: Left dominant configuration. Both origins are clearly patent. There is no dissection, occlusion or flow-limiting stenosis to the skull base (V1-V3 segments). CTA HEAD FINDINGS POSTERIOR CIRCULATION: --Vertebral arteries: Normal V4 segments. --Posterior inferior cerebellar arteries (PICA): Patent origins from the vertebral arteries. --Anterior inferior cerebellar arteries (AICA): Patent origins from the basilar artery. --Basilar artery: Normal. --Superior cerebellar arteries: Normal. --Posterior cerebral arteries: Normal. Both originate from the basilar artery. Posterior communicating arteries (p-comm) are diminutive or absent. ANTERIOR CIRCULATION: --Intracranial internal carotid arteries: Normal. --Anterior  cerebral arteries (ACA): Normal. Both A1 segments are present. Patent anterior communicating artery (a-comm). --Middle cerebral arteries (MCA): Normal. VENOUS SINUSES: As permitted by contrast timing, patent. ANATOMIC VARIANTS: None Review of the MIP images confirms the above findings. CT Brain Perfusion Findings: ASPECTS: 10 CBF (<30%) Volume: 74m Perfusion (Tmax>6.0s) volume: 067mMismatch Volume: 74m49mnfarction Location:None IMPRESSION: 1. Normal CTA of the head and neck. 2. Normal CT perfusion. 3. Severe left C6 neural foraminal stenosis secondary to uncovertebral hypertrophy. 4. Sclerotic lesion  within the C6 vertebral body. 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 Electronically Signed   By: KevUlyses JarredD.   On: 08/25/2019 00:58   CT Code Stroke CTA Neck W/WO contrast  Result Date: 08/25/2019 CLINICAL DATA:  Right-sided weakness EXAM: CT ANGIOGRAPHY HEAD AND NECK CT PERFUSION BRAIN TECHNIQUE: Multidetector CT imaging of the head and neck was performed using the standard protocol during bolus administration of intravenous contrast. Multiplanar CT image reconstructions and MIPs were obtained to evaluate the vascular anatomy. Carotid stenosis measurements (when applicable) are obtained utilizing NASCET criteria, using the distal internal carotid diameter as the denominator. Multiphase CT imaging of the brain was performed following IV bolus contrast injection. Subsequent parametric perfusion maps were calculated using RAPID software. CONTRAST:  115m874mNIPAQUE IOHEXOL 350 MG/ML SOLN COMPARISON:  Head CT 08/25/2019 FINDINGS: CTA NECK FINDINGS SKELETON: There is a sclerotic focus within the right C6 vertebral body. There is severe stenosis of the left C6 neural foramen due to uncovertebral hypertrophy. No bony spinal canal stenosis. OTHER NECK: Normal pharynx, larynx and major salivary glands. No cervical lymphadenopathy. Unremarkable thyroid gland. UPPER CHEST: No pneumothorax or pleural effusion. No nodules or masses. AORTIC ARCH: There is mild calcific atherosclerosis of the aortic arch. There is no aneurysm, dissection or hemodynamically significant stenosis of the visualized portion of the aorta. Conventional 3 vessel aortic branching pattern. The visualized proximal subclavian arteries are widely patent. RIGHT CAROTID SYSTEM: Normal without aneurysm, dissection or stenosis. LEFT CAROTID SYSTEM: Normal without aneurysm, dissection or stenosis. VERTEBRAL ARTERIES: Left dominant configuration. Both origins are clearly patent. There is no dissection, occlusion  or flow-limiting stenosis to the skull base (V1-V3 segments). CTA HEAD FINDINGS POSTERIOR CIRCULATION: --Vertebral arteries: Normal V4 segments. --Posterior inferior cerebellar arteries (PICA): Patent origins from the vertebral arteries. --Anterior inferior cerebellar arteries (AICA): Patent origins from the basilar artery. --Basilar artery: Normal. --Superior cerebellar arteries: Normal. --Posterior cerebral arteries: Normal. Both originate from the basilar artery. Posterior communicating arteries (p-comm) are diminutive or absent. ANTERIOR CIRCULATION: --Intracranial internal carotid arteries: Normal. --Anterior cerebral arteries (ACA): Normal. Both A1 segments are present. Patent anterior communicating artery (a-comm). --Middle cerebral arteries (MCA): Normal. VENOUS SINUSES: As permitted by contrast timing, patent. ANATOMIC VARIANTS: None Review of the MIP images confirms the above findings. CT Brain Perfusion Findings: ASPECTS: 10 CBF (<30%) Volume: 74mL 73mfusion (Tmax>6.0s) volume: 74mL M44match Volume: 74mL In74mction Location:None IMPRESSION: 1. Normal CTA of the head and neck. 2. Normal CT perfusion. 3. Severe left C6 neural foraminal stenosis secondary to uncovertebral hypertrophy. 4. Sclerotic lesion within the C6 vertebral body. 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 Electronically Signed   By: Kevin  Ulyses Jarred On: 08/25/2019 00:58   MR BRAIN WO CONTRAST  Result Date: 08/25/2019 CLINICAL DATA:  Stroke follow-up. Altered mental status and right arm weakness. EXAM: MRI HEAD WITHOUT CONTRAST TECHNIQUE: Multiplanar, multiecho pulse sequences of the brain and surrounding structures  were obtained without intravenous contrast. COMPARISON:  Head CT 08/25/2019 Brain MRI 10/29/2018 FINDINGS: Brain: No acute infarct, acute hemorrhage or extra-axial collection. Normal white matter signal. Mild generalized volume loss. No chronic microhemorrhage. Normal midline  structures. Vascular: Normal flow voids. Skull and upper cervical spine: Normal marrow signal. Sinuses/Orbits: Negative. Other: None. IMPRESSION: 1. No acute intracranial abnormality. 2. Mild generalized volume loss. Electronically Signed   By: Ulyses Jarred M.D.   On: 08/25/2019 01:23   CT Code Stroke Cerebral Perfusion with contrast  Result Date: 08/25/2019 CLINICAL DATA:  Right-sided weakness EXAM: CT ANGIOGRAPHY HEAD AND NECK CT PERFUSION BRAIN TECHNIQUE: Multidetector CT imaging of the head and neck was performed using the standard protocol during bolus administration of intravenous contrast. Multiplanar CT image reconstructions and MIPs were obtained to evaluate the vascular anatomy. Carotid stenosis measurements (when applicable) are obtained utilizing NASCET criteria, using the distal internal carotid diameter as the denominator. Multiphase CT imaging of the brain was performed following IV bolus contrast injection. Subsequent parametric perfusion maps were calculated using RAPID software. CONTRAST:  152m OMNIPAQUE IOHEXOL 350 MG/ML SOLN COMPARISON:  Head CT 08/25/2019 FINDINGS: CTA NECK FINDINGS SKELETON: There is a sclerotic focus within the right C6 vertebral body. There is severe stenosis of the left C6 neural foramen due to uncovertebral hypertrophy. No bony spinal canal stenosis. OTHER NECK: Normal pharynx, larynx and major salivary glands. No cervical lymphadenopathy. Unremarkable thyroid gland. UPPER CHEST: No pneumothorax or pleural effusion. No nodules or masses. AORTIC ARCH: There is mild calcific atherosclerosis of the aortic arch. There is no aneurysm, dissection or hemodynamically significant stenosis of the visualized portion of the aorta. Conventional 3 vessel aortic branching pattern. The visualized proximal subclavian arteries are widely patent. RIGHT CAROTID SYSTEM: Normal without aneurysm, dissection or stenosis. LEFT CAROTID SYSTEM: Normal without aneurysm, dissection or stenosis.  VERTEBRAL ARTERIES: Left dominant configuration. Both origins are clearly patent. There is no dissection, occlusion or flow-limiting stenosis to the skull base (V1-V3 segments). CTA HEAD FINDINGS POSTERIOR CIRCULATION: --Vertebral arteries: Normal V4 segments. --Posterior inferior cerebellar arteries (PICA): Patent origins from the vertebral arteries. --Anterior inferior cerebellar arteries (AICA): Patent origins from the basilar artery. --Basilar artery: Normal. --Superior cerebellar arteries: Normal. --Posterior cerebral arteries: Normal. Both originate from the basilar artery. Posterior communicating arteries (p-comm) are diminutive or absent. ANTERIOR CIRCULATION: --Intracranial internal carotid arteries: Normal. --Anterior cerebral arteries (ACA): Normal. Both A1 segments are present. Patent anterior communicating artery (a-comm). --Middle cerebral arteries (MCA): Normal. VENOUS SINUSES: As permitted by contrast timing, patent. ANATOMIC VARIANTS: None Review of the MIP images confirms the above findings. CT Brain Perfusion Findings: ASPECTS: 10 CBF (<30%) Volume: 065mPerfusion (Tmax>6.0s) volume: 60m58mismatch Volume: 60mL28mfarction Location:None IMPRESSION: 1. Normal CTA of the head and neck. 2. Normal CT perfusion. 3. Severe left C6 neural foraminal stenosis secondary to uncovertebral hypertrophy. 4. Sclerotic lesion within the C6 vertebral body. 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 Electronically Signed   By: KeviUlyses Jarred.   On: 08/25/2019 00:58   CT HEAD CODE STROKE WO CONTRAST  Result Date: 08/25/2019 CLINICAL DATA:  Code stroke.  Right-sided weakness EXAM: CT HEAD WITHOUT CONTRAST TECHNIQUE: Contiguous axial images were obtained from the base of the skull through the vertex without intravenous contrast. COMPARISON:  Brain MRI 10/29/2018 FINDINGS: Brain: There is no mass, hemorrhage or extra-axial collection. The size and configuration of the ventricles  and extra-axial CSF spaces are normal. The brain parenchyma is normal, without  evidence of acute or chronic infarction. Basal ganglia mineralization. Vascular: No abnormal hyperdensity of the major intracranial arteries or dural venous sinuses. No intracranial atherosclerosis. Skull: The visualized skull base, calvarium and extracranial soft tissues are normal. Sinuses/Orbits: No fluid levels or advanced mucosal thickening of the visualized paranasal sinuses. No mastoid or middle ear effusion. The orbits are normal. ASPECTS [facility] Stroke Program Early CT Score) - Ganglionic level infarction (caudate, lentiform nuclei, internal capsule, insula, M1-M3 cortex): 7 - Supraganglionic infarction (M4-M6 cortex): 3 Total score (0-10 with 10 being normal): 10 IMPRESSION: 1. Normal head CT for age. 2. ASPECTS is 10. * These results were communicated to Dr. Amie Portland at 12:24 am on 08/25/2019 by text page via the [facility] messaging system. Electronically Signed   By: Ulyses Jarred M.D.   On: 08/25/2019 00:24    Recent Labs: Lab Results  Component Value Date   WBC 8.6 08/25/2019   HGB 12.6 (L) 08/25/2019   PLT 154 08/25/2019   NA 139 08/25/2019   K 3.6 08/25/2019   CL 104 08/25/2019   CO2 20 (L) 08/25/2019   GLUCOSE 244 (H) 08/25/2019   BUN 19 08/25/2019   CREATININE 0.90 08/25/2019   BILITOT 0.5 08/25/2019   ALKPHOS 44 08/25/2019   AST 26 08/25/2019   ALT 18 08/25/2019   PROT 6.6 08/25/2019   ALBUMIN 3.8 08/25/2019   CALCIUM 8.4 (L) 08/25/2019   GFRAA >60 08/25/2019    Speciality Comments: No specialty comments available.  Procedures:  No procedures performed Allergies: Patient has no known allergies.   Assessment / Plan:     Visit Diagnoses: Pain in both feet - ANA 1: 80 nuclear, speckled, ENA negative, ESR 6, CK 96, complements WNL, cryoglobulin not detected, lupus anticoagulant not detected, Beta-2 IgA 23 which is not a significant titer.  Patient complains of his feet getting cold at night  and then followed by a hot sensation.  This causes discomfort.  He has not never noticed discoloration in his feet.  He denies any discoloration when he is exposed to the colder temperatures.  I am uncertain about the true etiology of his symptoms.  We discussed possible erythromelalgia.  He has good pulsations in his extremities.  He is considering going to a vascular surgeon for evaluation.  He is already on aspirin.  I cannot suggest anything further.  I have advised him to contact me in case his symptoms get worse.  Pain in both hands -he has been having cold and hot sensation in his hands at night as well which is not on a regular basis.  Patellofemoral arthritis both knees-currently not causing much discomfort.  Type 2 diabetes mellitus without complication, with long-term current use of insulin ([facility])  Lab test positive for detection of COVID-19 virus  Orders: No orders of the defined types were placed in this encounter.  No orders of the defined types were placed in this encounter.    Follow-Up Instructions: Return if symptoms worsen or fail to improve, for Joint pain.   Bo Merino, MD  Note - This record has been created using Editor, commissioning.  Chart creation errors have been sought, but may not always  have been located. Such creation errors do not reflect on  the standard of medical care.

## 2019-09-02 DIAGNOSIS — N401 Enlarged prostate with lower urinary tract symptoms: Secondary | ICD-10-CM | POA: Diagnosis not present

## 2019-09-02 DIAGNOSIS — R351 Nocturia: Secondary | ICD-10-CM | POA: Diagnosis not present

## 2019-09-02 MED FILL — TAMSULOSIN HCL 0.4 MG CAP: 0.4 | 30 days supply | Qty: 30 | Fill #0

## 2019-09-04 ENCOUNTER — Ambulatory Visit (INDEPENDENT_AMBULATORY_CARE_PROVIDER_SITE_OTHER): Payer: Medicare HMO | Admitting: Rheumatology

## 2019-09-04 ENCOUNTER — Other Ambulatory Visit: Payer: Self-pay

## 2019-09-04 ENCOUNTER — Encounter: Payer: Self-pay | Admitting: Rheumatology

## 2019-09-04 VITALS — BP 114/73 | HR 90 | Resp 15 | Ht 68.0 in | Wt 168.0 lb

## 2019-09-04 DIAGNOSIS — U071 COVID-19: Secondary | ICD-10-CM

## 2019-09-04 DIAGNOSIS — M1710 Unilateral primary osteoarthritis, unspecified knee: Secondary | ICD-10-CM

## 2019-09-04 DIAGNOSIS — M79641 Pain in right hand: Secondary | ICD-10-CM

## 2019-09-04 DIAGNOSIS — M79672 Pain in left foot: Secondary | ICD-10-CM | POA: Diagnosis not present

## 2019-09-04 DIAGNOSIS — M79642 Pain in left hand: Secondary | ICD-10-CM | POA: Diagnosis not present

## 2019-09-04 DIAGNOSIS — E119 Type 2 diabetes mellitus without complications: Secondary | ICD-10-CM | POA: Diagnosis not present

## 2019-09-04 DIAGNOSIS — Z794 Long term (current) use of insulin: Secondary | ICD-10-CM | POA: Diagnosis not present

## 2019-09-04 DIAGNOSIS — M79671 Pain in right foot: Secondary | ICD-10-CM | POA: Diagnosis not present

## 2019-09-10 ENCOUNTER — Other Ambulatory Visit: Payer: Self-pay

## 2019-09-10 ENCOUNTER — Telehealth (INDEPENDENT_AMBULATORY_CARE_PROVIDER_SITE_OTHER): Payer: Self-pay

## 2019-09-10 ENCOUNTER — Ambulatory Visit (INDEPENDENT_AMBULATORY_CARE_PROVIDER_SITE_OTHER): Payer: Medicare HMO | Admitting: Primary Care

## 2019-09-10 ENCOUNTER — Encounter (INDEPENDENT_AMBULATORY_CARE_PROVIDER_SITE_OTHER): Payer: Self-pay | Admitting: Primary Care

## 2019-09-10 DIAGNOSIS — Z794 Long term (current) use of insulin: Secondary | ICD-10-CM

## 2019-09-10 DIAGNOSIS — G4700 Insomnia, unspecified: Secondary | ICD-10-CM

## 2019-09-10 DIAGNOSIS — E119 Type 2 diabetes mellitus without complications: Secondary | ICD-10-CM

## 2019-09-10 NOTE — Progress Notes (Signed)
Virtual Visit via Telephone Note  I connected with Jordan Bryant on 09/10/19 at  2:10 PM EDT by telephone and verified that I am speaking with the correct person using two identifiers.   I discussed the limitations, risks, security and privacy concerns of performing an evaluation and management service by telephone and the availability of in person appointments. I also discussed with the patient that there may be a patient responsible charge related to this service. The patient expressed understanding and agreed to proceed.   History of Present Illness: Pt complains that he is unable to fall asleep at night. Can not fall asleep until 2 or 3 am. He is taking melontonin but does not work asked mg only taking 73m increase to 590mto improve sleep. If he falls asleep early around 11pm, he wakes up after 2 hours and can not go back to sleep for 3 hours  [facility]    Past Medical History:  Diagnosis Date  . Asthma   . Coronary artery disease   . Diabetes mellitus without complication ([place]  . [facility]   [facility]   Current Outpatient Medications on File Prior to Visit  Medication Sig Dispense Refill  . aspirin 81 MG chewable tablet Chew by mouth daily.    . Blood Glucose Monitoring Suppl (ONETOUCH VERIO FLEX SYSTEM) w/Device KIT 1 kit by Does not apply route 3 (three) times daily with meals as needed. 1 kit 0  . fluocinonide ointment (LIDEX) 0.7.62 APPLY 1 APPLICATION TOPICALLY 2 (TWO) TIMES DAILY. (Patient taking differently: Apply 1 application topically 2 (two) times daily as needed (itching). ) 30 g 0  . glucose blood (ONETOUCH VERIO) test strip Use as instructed 100 each 12  . Lancets MISC 1 each by Does not apply route 3 (three) times daily. 90 each 2  . linaclotide (LINZESS) 72 MCG capsule Take 1 capsule (72 mcg total) by mouth daily before breakfast. 90 capsule 3  . lisinopril (ZESTRIL) 5 MG tablet Take 5  mg by mouth daily.    . metFORMIN (GLUCOPHAGE) 1000 MG tablet Take 1 tablet (1,000 mg total) by mouth 2 (two) times daily with a meal. 180 tablet 1  . ondansetron (ZOFRAN ODT) 4 MG disintegrating tablet 65m765mDT q4 hours prn nausea/vomit 30 tablet 0  . OVER THE COUNTER MEDICATION Take 1 capsule by mouth in the morning and at bedtime. Berberry Extract    . rosuvastatin (CRESTOR) 10 MG tablet Take 1 tablet (10 mg total) by mouth daily. 90 tablet 3   Current Facility-Administered Medications on File Prior to Visit  Medication Dose Route Frequency Provider Last Rate Last Admin  . 0.9 %  sodium chloride infusion  500 mL Intravenous Once DanDoran StablerD       Observations/Objective: Review of Systems  Psychiatric/Behavioral: The patient has insomnia.    Assessment and Plan: Can try melatonin 5mg32m mg at night for sleep, can also do benadryl 25-50mg6mnight for sleep.  If this does not help we can try prescription medication.  Also here is some information about good sleep hygiene.   I discussed the assessment and treatment plan with the patient. The patient was provided an opportunity to ask questions and all were answered. The patient agreed with the plan and demonstrated an understanding of the instructions.   The patient was advised to call back or seek an in-person evaluation if  the symptoms worsen or if the condition fails to improve as anticipated.  I provided 74mnutes of non-face-to-face time during this encounter.   MKerin Perna NP

## 2019-09-10 NOTE — Telephone Encounter (Signed)
I connected with  Jordan Bryant on 09/10/19 by a video enabled telemedicine application and verified that I am speaking with the correct person using two identifiers.   I discussed the limitations of evaluation and management by telemedicine. The patient expressed understanding and agreed to proceed. Maryjean Morn, CMA

## 2019-09-10 NOTE — Progress Notes (Signed)
Pt complains that he is unable to fall asleep at night. Can not fall asleep until 2 or 3 am.  If he falls asleep early around 11pm, he wakes up after 2 hours and can not go back to sleep for 3 hours  Pt has changed pharmacy to [facility], medications will be free there under insurance

## 2019-09-11 ENCOUNTER — Other Ambulatory Visit (INDEPENDENT_AMBULATORY_CARE_PROVIDER_SITE_OTHER): Payer: Medicare HMO

## 2019-09-11 DIAGNOSIS — Z794 Long term (current) use of insulin: Secondary | ICD-10-CM | POA: Diagnosis not present

## 2019-09-11 DIAGNOSIS — E119 Type 2 diabetes mellitus without complications: Secondary | ICD-10-CM | POA: Diagnosis not present

## 2019-09-12 LAB — LIPID PANEL
Chol/HDL Ratio: 2.2 ratio (ref 0.0–5.0)
Cholesterol, Total: 112 mg/dL (ref 100–199)
HDL: 51 mg/dL (ref >39)
LDL Chol Calc (NIH): 45 mg/dL (ref 0–99)
Triglycerides: 81 mg/dL (ref 0–149)
VLDL Cholesterol Cal: 16 mg/dL (ref 5–40)

## 2019-09-18 ENCOUNTER — Telehealth (INDEPENDENT_AMBULATORY_CARE_PROVIDER_SITE_OTHER): Payer: Self-pay

## 2019-09-18 NOTE — Telephone Encounter (Signed)
Patient called to request all his medication to be sent to [facility] on [address]. Patient states they have better prices with [employer].  Please advice [phone]

## 2019-09-18 NOTE — Telephone Encounter (Signed)
Sent to PCP ?

## 2019-09-19 ENCOUNTER — Other Ambulatory Visit (INDEPENDENT_AMBULATORY_CARE_PROVIDER_SITE_OTHER): Payer: Self-pay | Admitting: Primary Care

## 2019-09-19 DIAGNOSIS — Z794 Long term (current) use of insulin: Secondary | ICD-10-CM

## 2019-09-19 DIAGNOSIS — R69 Illness, unspecified: Secondary | ICD-10-CM | POA: Diagnosis not present

## 2019-09-19 DIAGNOSIS — E119 Type 2 diabetes mellitus without complications: Secondary | ICD-10-CM

## 2019-09-19 MED ORDER — ROSUVASTATIN CALCIUM 10 MG PO TABS: 10.0000 mg | ORAL_TABLET | Freq: Every day | ORAL | 3 refills | Status: DC

## 2019-09-19 MED ORDER — ONDANSETRON 4 MG PO TBDP: ORAL_TABLET | ORAL | 0 refills | Status: DC

## 2019-09-19 MED ORDER — GLIPIZIDE 5 MG PO TABS: 5.0000 mg | ORAL_TABLET | Freq: Two times a day (BID) | ORAL | 3 refills | Status: DC

## 2019-09-19 MED ORDER — ONETOUCH VERIO VI STRP: ORAL_STRIP | 12 refills | Status: DC

## 2019-09-19 MED ORDER — LANCETS MISC: 1.0000 | Freq: Three times a day (TID) | 2 refills | Status: DC

## 2019-09-19 MED ORDER — METFORMIN HCL 1000 MG PO TABS: 1000.0000 mg | ORAL_TABLET | Freq: Two times a day (BID) | ORAL | 1 refills | Status: DC

## 2019-09-19 MED ORDER — LISINOPRIL 5 MG PO TABS: 5.0000 mg | ORAL_TABLET | Freq: Every day | ORAL | 1 refills | Status: DC

## 2019-09-20 ENCOUNTER — Telehealth (INDEPENDENT_AMBULATORY_CARE_PROVIDER_SITE_OTHER): Payer: Self-pay

## 2019-09-20 NOTE — Telephone Encounter (Signed)
Patient called to make a medication refill for   fluocinonide ointment (LIDEX) 0.05   Patient uses   [id number] - [place], [place] - [address]  [address] [place] [address]   Please advice [phone]

## 2019-09-20 NOTE — Telephone Encounter (Signed)
Sent to PCP ?

## 2019-09-27 ENCOUNTER — Other Ambulatory Visit (INDEPENDENT_AMBULATORY_CARE_PROVIDER_SITE_OTHER): Payer: Self-pay | Admitting: Primary Care

## 2019-09-27 DIAGNOSIS — L309 Dermatitis, unspecified: Secondary | ICD-10-CM

## 2019-09-27 MED ORDER — FLUOCINONIDE 0.05 % EX OINT: 1.0000 "application " | TOPICAL_OINTMENT | Freq: Two times a day (BID) | CUTANEOUS | 0 refills | Status: DC

## 2019-09-27 MED FILL — FLUOCINONIDE 0.05% OINTMENT: 0.05 | 14 days supply | Qty: 30 | Fill #0

## 2019-10-16 NOTE — Progress Notes (Deleted)
Office Visit Note  Patient: Jordan Bryant             Date of Birth: 24-Mar-1956           MRN: [id number]             PCP: Grayce Sessions, NP Referring: Grayce Sessions, NP Visit Date: 10/24/2019 Occupation: @GUAROCC @  Subjective:  No chief complaint on file.   History of Present Illness: Jordan Bryant is a 64 y.o. male ***   Activities of Daily Living:  Patient reports morning stiffness for *** {minute/hour:19697}.   Patient {ACTIONS;DENIES/REPORTS:21021675::"Denies"} nocturnal pain.  Difficulty dressing/grooming: {ACTIONS;DENIES/REPORTS:21021675::"Denies"} Difficulty climbing stairs: {ACTIONS;DENIES/REPORTS:21021675::"Denies"} Difficulty getting out of chair: {ACTIONS;DENIES/REPORTS:21021675::"Denies"} Difficulty using hands for taps, buttons, cutlery, and/or writing: {ACTIONS;DENIES/REPORTS:21021675::"Denies"}  No Rheumatology ROS completed.   PMFS History:  Patient Active Problem List   Diagnosis Date Noted  . Patellofemoral arthritis both knees 06/27/2019  . Type 2 diabetes mellitus without complication, with long-term current use of insulin (HCC) 11/22/2016    Past Medical History:  Diagnosis Date  . Asthma   . Coronary artery disease   . Diabetes mellitus without complication (HCC)   . Kidney stone   . Stroke [facility])     Family History  Problem Relation Age of Onset  . Heart attack Mother   . Heart attack Father   . Colon cancer Neg Hx   . Esophageal cancer Neg Hx   . Liver cancer Neg Hx   . Pancreatic cancer Neg Hx   . Rectal cancer Neg Hx   . Stomach cancer Neg Hx    Past Surgical History:  Procedure Laterality Date  . Kidney Stones    . PROSTATE SURGERY     Social History   Social History Narrative   Lives with wife   Caffeine- one coffee/tea daily   Immunization History  Administered Date(s) Administered  . Influenza,inj,Quad PF,6+ Mos 02/06/2018  . Pneumococcal Conjugate-13 02/01/2017  . Pneumococcal Polysaccharide-23 02/06/2018    . Tdap 02/01/2017     Objective: Vital Signs: There were no vitals taken for this visit.   Physical Exam   Musculoskeletal Exam: ***  CDAI Exam: CDAI Score: -- Patient Global: --; Provider Global: -- Swollen: --; Tender: -- Joint Exam 10/24/2019   No joint exam has been documented for this visit   There is currently no information documented on the homunculus. Go to the Rheumatology activity and complete the homunculus joint exam.  Investigation: No additional findings.  Imaging: No results found.  Recent Labs: Lab Results  Component Value Date   WBC 8.6 08/25/2019   HGB 12.6 (L) 08/25/2019   PLT 154 08/25/2019   NA 139 08/25/2019   K 3.6 08/25/2019   CL 104 08/25/2019   CO2 20 (L) 08/25/2019   GLUCOSE 244 (H) 08/25/2019   BUN 19 08/25/2019   CREATININE 0.90 08/25/2019   BILITOT 0.5 08/25/2019   ALKPHOS 44 08/25/2019   AST 26 08/25/2019   ALT 18 08/25/2019   PROT 6.6 08/25/2019   ALBUMIN 3.8 08/25/2019   CALCIUM 8.4 (L) 08/25/2019   GFRAA >60 08/25/2019    Speciality Comments: No specialty comments available.  Procedures:  No procedures performed Allergies: Patient has no known allergies.   Assessment / Plan:     Visit Diagnoses: No diagnosis found.  Orders: No orders of the defined types were placed in this encounter.  No orders of the defined types were placed in this encounter.   Face-to-face time spent with patient  was *** minutes. Greater than 50% of time was spent in counseling and coordination of care.  Follow-Up Instructions: No follow-ups on file.   Earnestine Mealing, CMA  Note - This record has been created using Editor, commissioning.  Chart creation errors have been sought, but may not always  have been located. Such creation errors do not reflect on  the standard of medical care.

## 2019-10-18 DIAGNOSIS — R69 Illness, unspecified: Secondary | ICD-10-CM | POA: Diagnosis not present

## 2019-10-23 ENCOUNTER — Other Ambulatory Visit (INDEPENDENT_AMBULATORY_CARE_PROVIDER_SITE_OTHER): Payer: Self-pay | Admitting: Primary Care

## 2019-10-23 ENCOUNTER — Telehealth (INDEPENDENT_AMBULATORY_CARE_PROVIDER_SITE_OTHER): Payer: Self-pay

## 2019-10-23 DIAGNOSIS — L309 Dermatitis, unspecified: Secondary | ICD-10-CM

## 2019-10-23 MED ORDER — FLUOCINONIDE 0.05 % EX OINT: 1.0000 "application " | TOPICAL_OINTMENT | Freq: Two times a day (BID) | CUTANEOUS | 0 refills | Status: DC

## 2019-10-23 NOTE — Telephone Encounter (Signed)
Patient called wantin to know if PCP can send  fluocinonide ointment (LIDEX) 0.05 %  to [id number] - [place], [place] - [address] instead to [facility] pharmacy. Patient states he no longer uses [facility].  Please advice [phone]

## 2019-10-23 NOTE — Telephone Encounter (Signed)
Will forward to pcp

## 2019-10-24 ENCOUNTER — Ambulatory Visit: Payer: Medicare HMO | Admitting: Rheumatology

## 2019-10-24 DIAGNOSIS — U071 COVID-19: Secondary | ICD-10-CM

## 2019-10-24 DIAGNOSIS — M1710 Unilateral primary osteoarthritis, unspecified knee: Secondary | ICD-10-CM

## 2019-10-24 DIAGNOSIS — M79672 Pain in left foot: Secondary | ICD-10-CM

## 2019-10-24 DIAGNOSIS — M79641 Pain in right hand: Secondary | ICD-10-CM

## 2019-10-24 DIAGNOSIS — Z794 Long term (current) use of insulin: Secondary | ICD-10-CM

## 2019-11-05 ENCOUNTER — Telehealth (INDEPENDENT_AMBULATORY_CARE_PROVIDER_SITE_OTHER): Payer: Self-pay

## 2019-11-05 NOTE — Telephone Encounter (Signed)
Patient called to request all his medications. Patient states he will travel out of state for 3 months and will not be able to fill his prescriptions.  Patient uses [facility] on [address]   Please advice [phone]

## 2019-11-05 NOTE — Telephone Encounter (Signed)
Patient aware to contact pharmacy all refills were sent in April with additional refills.

## 2019-11-07 DIAGNOSIS — R69 Illness, unspecified: Secondary | ICD-10-CM | POA: Diagnosis not present

## 2019-11-10 DIAGNOSIS — Z20828 Contact with and (suspected) exposure to other viral communicable diseases: Secondary | ICD-10-CM | POA: Diagnosis not present

## 2019-11-10 DIAGNOSIS — Z20822 Contact with and (suspected) exposure to covid-19: Secondary | ICD-10-CM | POA: Diagnosis not present

## 2019-11-12 ENCOUNTER — Telehealth (INDEPENDENT_AMBULATORY_CARE_PROVIDER_SITE_OTHER): Payer: Self-pay

## 2019-11-12 DIAGNOSIS — R69 Illness, unspecified: Secondary | ICD-10-CM | POA: Diagnosis not present

## 2019-11-12 NOTE — Telephone Encounter (Signed)
Patient called to request a refill for   Lancets MISC   And   glucose blood (ONETOUCH VERIO) test strip  for 3 months states he is traveling internationally today at 12pm and will not be able to to pick up any medication.  Patient uses [facility]

## 2019-12-30 ENCOUNTER — Ambulatory Visit (INDEPENDENT_AMBULATORY_CARE_PROVIDER_SITE_OTHER): Payer: Medicare HMO | Admitting: Primary Care

## 2020-01-27 ENCOUNTER — Other Ambulatory Visit (INDEPENDENT_AMBULATORY_CARE_PROVIDER_SITE_OTHER): Payer: Self-pay | Admitting: Primary Care

## 2020-01-27 DIAGNOSIS — L309 Dermatitis, unspecified: Secondary | ICD-10-CM

## 2020-01-27 DIAGNOSIS — R69 Illness, unspecified: Secondary | ICD-10-CM | POA: Diagnosis not present

## 2020-01-27 DIAGNOSIS — E119 Type 2 diabetes mellitus without complications: Secondary | ICD-10-CM

## 2020-01-27 NOTE — Telephone Encounter (Signed)
Requested medication (s) are due for refill today:no  Requested medication (s) are on the active medication list: yes  Last refill:  10/23/2019  Future visit scheduled: no  Notes to clinic: Medication not assigned to a protocol, review manually   Requested Prescriptions  Pending Prescriptions Disp Refills   fluocinonide ointment (LIDEX) 0.05 % [Pharmacy Med Name: Fluocinonide External Ointment 0.05 %] 30 g 0    Sig: APPLY ONE APPLICATION TOPICALLY TWICE DAILY      Off-Protocol Failed - 01/27/2020 12:46 PM      Failed - Medication not assigned to a protocol, review manually.      Passed - Valid encounter within last 12 months    Recent Outpatient Visits           4 months ago Type 2 diabetes mellitus without complication, with long-term current use of insulin ([place])   [place] Juluis Mire P, NP   6 months ago Type 2 diabetes mellitus without complication, without long-term current use of insulin ([place])   [place], Michelle P, NP   9 months ago Pain in both knees, unspecified chronicity   [place] Juluis Mire P, NP   10 months ago Type 2 diabetes mellitus without complication, with long-term current use of insulin ([place])   [place] Kerin Perna, NP   1 year ago Type 2 diabetes mellitus without complication, with long-term current use of insulin ([place])   [place] [facility] Kerin Perna, NP               Signed Prescriptions Disp Refills   lisinopril (ZESTRIL) 5 MG tablet 90 tablet 0    Sig: TAKE ONE TABLET BY MOUTH ONE TIME DAILY      Cardiovascular:  ACE Inhibitors Passed - 01/27/2020 12:46 PM      Passed - Cr in normal range and within 180 days    Creatinine, Ser  Date Value Ref Range Status  08/25/2019 0.90 0.61 - 1.24 mg/dL Final          Passed - K in normal range and within 180 days    Potassium  Date Value Ref  Range Status  08/25/2019 3.6 3.5 - 5.1 mmol/L Final          Passed - Patient is not pregnant      Passed - Last BP in normal range    BP Readings from Last 1 Encounters:  09/04/19 114/73          Passed - Valid encounter within last 6 months    Recent Outpatient Visits           4 months ago Type 2 diabetes mellitus without complication, with long-term current use of insulin ([place])   [place] [facility] Juluis Mire P, NP   6 months ago Type 2 diabetes mellitus without complication, without long-term current use of insulin ([place])   [place] Juluis Mire P, NP   9 months ago Pain in both knees, unspecified chronicity   [place] [facility] Juluis Mire P, NP   10 months ago Type 2 diabetes mellitus without complication, with long-term current use of insulin ([place])   [place] [facility] Kerin Perna, NP   1 year ago Type 2 diabetes mellitus without complication, with long-term current use of insulin ([place])   [place],  [place] Cage, NP                metFORMIN (GLUCOPHAGE) 1000 MG tablet 180 tablet 0    Sig: TAKE ONE TABLET BY MOUTH TWICE A DAY WITH MEALS      Endocrinology:  Diabetes - Biguanides Failed - 01/27/2020 12:46 PM      Failed - HBA1C is between 0 and 7.9 and within 180 days    HbA1c, POC (controlled diabetic range)  Date Value Ref Range Status  07/02/2019 6.7 0.0 - 7.0 % Final          Passed - Cr in normal range and within 360 days    Creatinine, Ser  Date Value Ref Range Status  08/25/2019 0.90 0.61 - 1.24 mg/dL Final          Passed - eGFR in normal range and within 360 days    GFR calc Af Amer  Date Value Ref Range Status  08/25/2019 >60 >60 mL/min Final   GFR calc non Af Amer  Date Value Ref Range Status  08/25/2019 >60 >60 mL/min Final          Passed - Valid encounter within last 6 months    Recent Outpatient Visits            4 months ago Type 2 diabetes mellitus without complication, with long-term current use of insulin (HCC)   [place] [facility] Juluis Mire P, NP   6 months ago Type 2 diabetes mellitus without complication, without long-term current use of insulin ([place])   [place] [facility] Juluis Mire P, NP   9 months ago Pain in both knees, unspecified chronicity   [place], Michelle P, NP   10 months ago Type 2 diabetes mellitus without complication, with long-term current use of insulin ([place])   [place] [facility] Juluis Mire P, NP   1 year ago Type 2 diabetes mellitus without complication, with long-term current use of insulin ([place])   [facility] Kerin Perna, NP

## 2020-01-28 DIAGNOSIS — R69 Illness, unspecified: Secondary | ICD-10-CM | POA: Diagnosis not present

## 2020-02-12 ENCOUNTER — Other Ambulatory Visit (INDEPENDENT_AMBULATORY_CARE_PROVIDER_SITE_OTHER): Payer: Self-pay | Admitting: Primary Care

## 2020-02-12 DIAGNOSIS — K5900 Constipation, unspecified: Secondary | ICD-10-CM

## 2020-02-12 DIAGNOSIS — H52209 Unspecified astigmatism, unspecified eye: Secondary | ICD-10-CM | POA: Diagnosis not present

## 2020-02-12 DIAGNOSIS — H5203 Hypermetropia, bilateral: Secondary | ICD-10-CM | POA: Diagnosis not present

## 2020-02-12 NOTE — Telephone Encounter (Signed)
Medication Refill - Medication: linaclotide (LINZESS) 72 MCG capsule   Has the patient contacted their pharmacy? Yes.   (Agent: If no, request that the patient contact the pharmacy for the refill.) (Agent: If yes, when and what did the pharmacy advise?)  Preferred Pharmacy (with phone number or street name):  [facility] [address] [place] - [address]  [address] [place] [address]  Phone: [phone] Fax: [phone]     Agent: Please be advised that RX refills may take up to 3 business days. We ask that you follow-up with your pharmacy.

## 2020-02-12 NOTE — Telephone Encounter (Signed)
Requested medication (s) are due for refill today: no  Requested medication (s) are on the active medication list:  yes   Future visit scheduled: no  Notes to clinic: review for refill Filled by a different provider    Requested Prescriptions  Pending Prescriptions Disp Refills   linaclotide (LINZESS) 72 MCG capsule 90 capsule 3    Sig: Take 1 capsule (72 mcg total) by mouth daily before breakfast.      Gastroenterology: Irritable Bowel Syndrome Passed - 02/12/2020 12:32 PM      Passed - Valid encounter within last 12 months    Recent Outpatient Visits           5 months ago Type 2 diabetes mellitus without complication, with long-term current use of insulin (HCC)   [place] [facility] Gwinda Passe P, NP   7 months ago Type 2 diabetes mellitus without complication, without long-term current use of insulin (HCC)   [place] [facility] Gwinda Passe P, NP   10 months ago Pain in both knees, unspecified chronicity   [place] [facility] Gwinda Passe P, NP   11 months ago Type 2 diabetes mellitus without complication, with long-term current use of insulin (HCC)   [place] [facility] Gwinda Passe P, NP   1 year ago Type 2 diabetes mellitus without complication, with long-term current use of insulin (HCC)   [facility] Grayce Sessions, NP

## 2020-02-18 ENCOUNTER — Telehealth (INDEPENDENT_AMBULATORY_CARE_PROVIDER_SITE_OTHER): Payer: Self-pay

## 2020-02-18 NOTE — Telephone Encounter (Signed)
Patient called to request refill of linzess. Please send if appropriate. Patient states he takes for constipation as needed. Send to [facility] pharmacy. Maryjean Morn, CMA

## 2020-02-19 ENCOUNTER — Other Ambulatory Visit (INDEPENDENT_AMBULATORY_CARE_PROVIDER_SITE_OTHER): Payer: Self-pay | Admitting: Primary Care

## 2020-02-19 DIAGNOSIS — K5900 Constipation, unspecified: Secondary | ICD-10-CM

## 2020-02-19 MED ORDER — LINACLOTIDE 72 MCG PO CAPS: 72.0000 ug | ORAL_CAPSULE | Freq: Every day | ORAL | 3 refills | Status: DC

## 2020-02-26 ENCOUNTER — Encounter (INDEPENDENT_AMBULATORY_CARE_PROVIDER_SITE_OTHER): Payer: Self-pay | Admitting: Primary Care

## 2020-02-26 ENCOUNTER — Ambulatory Visit (INDEPENDENT_AMBULATORY_CARE_PROVIDER_SITE_OTHER): Payer: Medicare HMO | Admitting: Primary Care

## 2020-02-26 ENCOUNTER — Other Ambulatory Visit: Payer: Self-pay | Admitting: Pharmacist

## 2020-02-26 ENCOUNTER — Other Ambulatory Visit: Payer: Self-pay

## 2020-02-26 VITALS — BP 133/80 | HR 81 | Temp 98.2°F | Ht 68.0 in | Wt 167.8 lb

## 2020-02-26 DIAGNOSIS — F40218 Other animal type phobia: Secondary | ICD-10-CM | POA: Diagnosis not present

## 2020-02-26 DIAGNOSIS — Z23 Encounter for immunization: Secondary | ICD-10-CM

## 2020-02-26 DIAGNOSIS — E119 Type 2 diabetes mellitus without complications: Secondary | ICD-10-CM

## 2020-02-26 DIAGNOSIS — K5900 Constipation, unspecified: Secondary | ICD-10-CM

## 2020-02-26 DIAGNOSIS — Z794 Long term (current) use of insulin: Secondary | ICD-10-CM | POA: Diagnosis not present

## 2020-02-26 DIAGNOSIS — R69 Illness, unspecified: Secondary | ICD-10-CM | POA: Diagnosis not present

## 2020-02-26 DIAGNOSIS — Z760 Encounter for issue of repeat prescription: Secondary | ICD-10-CM | POA: Diagnosis not present

## 2020-02-26 LAB — POCT GLYCOSYLATED HEMOGLOBIN (HGB A1C): Hemoglobin A1C: 6.7 % — AB (ref 4.0–5.6)

## 2020-02-26 LAB — GLUCOSE, POCT (MANUAL RESULT ENTRY): POC Glucose: 144 mg/dl — AB (ref 70–99)

## 2020-02-26 MED ORDER — ROSUVASTATIN CALCIUM 10 MG PO TABS: 10.0000 mg | ORAL_TABLET | Freq: Every day | ORAL | 3 refills | Status: DC

## 2020-02-26 MED ORDER — GLIPIZIDE 5 MG PO TABS: 5.0000 mg | ORAL_TABLET | Freq: Two times a day (BID) | ORAL | 3 refills | Status: AC

## 2020-02-26 MED ORDER — METFORMIN HCL 1000 MG PO TABS: 1000.0000 mg | ORAL_TABLET | Freq: Two times a day (BID) | ORAL | 0 refills | Status: DC

## 2020-02-26 MED ORDER — FLUTICASONE PROPIONATE 50 MCG/ACT NA SUSP: 2.0000 | Freq: Every day | NASAL | 6 refills | Status: AC

## 2020-02-26 MED ORDER — FREESTYLE SYSTEM KIT: PACK | 0 refills | Status: DC

## 2020-02-26 MED ORDER — OMEGA-3-ACID ETHYL ESTERS 1 G PO CAPS: 1.0000 g | ORAL_CAPSULE | Freq: Two times a day (BID) | ORAL | 1 refills | Status: AC

## 2020-02-26 MED ORDER — FREESTYLE SYSTEM KIT: 1.0000 | PACK | 0 refills | Status: DC | PRN

## 2020-02-26 MED ORDER — LISINOPRIL 5 MG PO TABS
2.5000 mg | ORAL_TABLET | Freq: Every day | ORAL | 2 refills | Status: DC
Start: 2020-02-26 — End: 2020-07-29

## 2020-02-26 MED ORDER — LISINOPRIL 5 MG PO TABS: 5.0000 mg | ORAL_TABLET | Freq: Every day | ORAL | 0 refills | Status: DC

## 2020-02-26 MED ORDER — FREESTYLE SYSTEM KIT: PACK | 0 refills | Status: AC

## 2020-02-26 MED ORDER — LUBIPROSTONE 24 MCG PO CAPS: 24.0000 ug | ORAL_CAPSULE | Freq: Two times a day (BID) | ORAL | 3 refills | Status: DC

## 2020-02-26 NOTE — Progress Notes (Signed)
Established Patient Office Visit  Subjective:  Patient ID: Jordan Bryant, male    DOB: 01-13-1956  Age: 64 y.o. MRN: [id number]  CC:  Chief Complaint  Patient presents with  . Diabetes    HPI Jordan Bryant presents for management of type 2 diabetes and  Complicated constipation Lizziness is too expensive  Taking fiber, colon cleaner , bowel remover water and vegetables and unable to have bowel movement without lizziness  Past Medical History:  Diagnosis Date  . Asthma   . Coronary artery disease   . Diabetes mellitus without complication ([place])   . Kidney stone   . Stroke [facility])     Past Surgical History:  Procedure Laterality Date  . Kidney Stones    . PROSTATE SURGERY      Family History  Problem Relation Age of Onset  . Heart attack Mother   . Heart attack Father   . Colon cancer Neg Hx   . Esophageal cancer Neg Hx   . Liver cancer Neg Hx   . Pancreatic cancer Neg Hx   . Rectal cancer Neg Hx   . Stomach cancer Neg Hx     Social History   Socioeconomic History  . Marital status: Married    Spouse name: Not on file  . Number of children: Not on file  . Years of education: Not on file  . Highest education level: Master's degree (e.g., MA, MS, MEng, MEd, MSW, MBA)  Occupational History    Comment: part time  Tobacco Use  . Smoking status: Never Smoker  . Smokeless tobacco: Never Used  Vaping Use  . Vaping Use: Never used  Substance and Sexual Activity  . Alcohol use: No  . Drug use: No  . Sexual activity: Yes  Other Topics Concern  . Not on file  Social History Narrative   Lives with wife   Caffeine- one coffee/tea daily   Social Determinants of Health   Financial Resource Strain:   . Difficulty of Paying Living Expenses: Not on file  Food Insecurity:   . Worried About Charity fundraiser in the Last Year: Not on file  . Ran Out of Food in the Last Year: Not on file  Transportation Needs:   . Lack of Transportation (Medical): Not on file  .  Lack of Transportation (Non-Medical): Not on file  Physical Activity:   . Days of Exercise per Week: Not on file  . Minutes of Exercise per Session: Not on file  Stress:   . Feeling of Stress : Not on file  Social Connections:   . Frequency of Communication with Friends and Family: Not on file  . Frequency of Social Gatherings with Friends and Family: Not on file  . Attends Religious Services: Not on file  . Active Member of Clubs or Organizations: Not on file  . Attends Archivist Meetings: Not on file  . Marital Status: Not on file  Intimate Partner Violence:   . Fear of Current or Ex-Partner: Not on file  . Emotionally Abused: Not on file  . Physically Abused: Not on file  . Sexually Abused: Not on file    Outpatient Medications Prior to Visit  Medication Sig Dispense Refill  . aspirin 81 MG chewable tablet Chew by mouth daily.    . fluocinonide ointment (LIDEX) 6.73 % APPLY ONE APPLICATION TOPICALLY TWICE DAILY 30 g 0  . glucose blood (ONETOUCH VERIO) test strip Use as instructed 100 each 12  .  Lancets MISC 1 each by Does not apply route 3 (three) times daily. 90 each 2  . ondansetron (ZOFRAN ODT) 4 MG disintegrating tablet 94m ODT q4 hours prn nausea/vomit 30 tablet 0  . OVER THE COUNTER MEDICATION Take 1 capsule by mouth in the morning and at bedtime. Berberry Extract    . Blood Glucose Monitoring Suppl (ONETOUCH VERIO FLEX SYSTEM) w/Device KIT 1 kit by Does not apply route 3 (three) times daily with meals as needed. 1 kit 0  . glipiZIDE (GLUCOTROL) 5 MG tablet Take 1 tablet (5 mg total) by mouth 2 (two) times daily before a meal. 120 tablet 3  . lisinopril (ZESTRIL) 5 MG tablet TAKE ONE TABLET BY MOUTH ONE TIME DAILY 90 tablet 0  . metFORMIN (GLUCOPHAGE) 1000 MG tablet TAKE ONE TABLET BY MOUTH TWICE A DAY WITH MEALS 180 tablet 0  . rosuvastatin (CRESTOR) 10 MG tablet Take 1 tablet (10 mg total) by mouth daily. 90 tablet 3  . tamsulosin (FLOMAX) 0.4 MG CAPS capsule  Take 0.4 mg by mouth daily.    .Marland Kitchenlinaclotide (LINZESS) 72 MCG capsule Take 1 capsule (72 mcg total) by mouth daily before breakfast. (Patient not taking: Reported on 02/26/2020) 90 capsule 3   Facility-Administered Medications Prior to Visit  Medication Dose Route Frequency Provider Last Rate Last Admin  . 0.9 %  sodium chloride infusion  500 mL Intravenous Once DNelida MeuseIII, MD        No Known Allergies  ROS Review of Systems  Gastrointestinal: Positive for abdominal distention and constipation.      Objective:    Physical Exam Vitals reviewed.  HENT:     Head: Normocephalic.     Nose: Nose normal.  Eyes:     Extraocular Movements: Extraocular movements intact.  Cardiovascular:     Rate and Rhythm: Normal rate and regular rhythm.     Pulses: Normal pulses.     Heart sounds: Normal heart sounds.  Pulmonary:     Effort: Pulmonary effort is normal.     Breath sounds: Normal breath sounds.  Abdominal:     General: Bowel sounds are normal.  Musculoskeletal:        General: Normal range of motion.     Cervical back: Normal range of motion.  Skin:    General: Skin is warm and dry.  Neurological:     Mental Status: He is alert and oriented to person, place, and time.  Psychiatric:        Mood and Affect: Mood normal.        Behavior: Behavior normal.        Thought Content: Thought content normal.        Judgment: Judgment normal.     BP 133/80 (BP Location: Left Arm, Patient Position: Sitting, Cuff Size: Normal)   Pulse 81   Temp 98.2 F (36.8 C) (Temporal)   Ht _0  (1.727 m)   Wt 167 lb 12.8 oz (76.1 kg)   SpO2 94%   BMI 25.51 kg/m  Wt Readings from Last 3 Encounters:  02/26/20 167 lb 12.8 oz (76.1 kg)  09/04/19 168 lb (76.2 kg)  08/25/19 164 lb 14.5 oz (74.8 kg)     Health Maintenance Due  Topic Date Due  . OPHTHALMOLOGY EXAM  Never done  . COVID-19 Vaccine (1) Never done    There are no preventive care reminders to display for this  patient.  Lab Results  Component Value Date   TSH  1.190 03/14/2019   Lab Results  Component Value Date   WBC 8.6 08/25/2019   HGB 12.6 (L) 08/25/2019   HCT 37.0 (L) 08/25/2019   MCV 82.7 08/25/2019   PLT 154 08/25/2019   Lab Results  Component Value Date   NA 139 08/25/2019   K 3.6 08/25/2019   CO2 20 (L) 08/25/2019   GLUCOSE 244 (H) 08/25/2019   BUN 19 08/25/2019   CREATININE 0.90 08/25/2019   BILITOT 0.5 08/25/2019   ALKPHOS 44 08/25/2019   AST 26 08/25/2019   ALT 18 08/25/2019   PROT 6.6 08/25/2019   ALBUMIN 3.8 08/25/2019   CALCIUM 8.4 (L) 08/25/2019   ANIONGAP 10 08/25/2019   Lab Results  Component Value Date   CHOL 112 09/11/2019   Lab Results  Component Value Date   HDL 51 09/11/2019   Lab Results  Component Value Date   LDLCALC 45 09/11/2019   Lab Results  Component Value Date   TRIG 81 09/11/2019   Lab Results  Component Value Date   CHOLHDL 2.2 09/11/2019   Lab Results  Component Value Date   HGBA1C 6.7 (A) 02/26/2020      Assessment & Plan:  Jordan Bryant was seen today for diabetes.  Diagnoses and all orders for this visit:  Type 2 diabetes mellitus without complication, with long-term current use of insulin (HCC) -     HgB A1c 6.7 consistent no changes in medications   -     Glucose (CBG) Patient wanted new meter hand written script given.  -     glucose monitoring kit (FREESTYLE) monitoring kit; Take blood sugars once daily metFORMIN (GLUCOPHAGE) 1000 MG tablet; Take 1 tablet (1,000 mg total) by mouth 2 (two) times daily with a meal.  Constipation, unspecified constipation type Consulted with GI advised to increase water intake to 70-80oz a day continue with fiber intake and eating with fresh fruits and vegetable but if insurance would not cover Lizziness likely not to cover Amitiza. Can be treated with senokot S or metamucil. -     lubiprostone (AMITIZA) 24 MCG capsule; Take 1 capsule (24 mcg total) by mouth 2 (two) times daily with a  meal. -     Ambulatory referral to Gastroenterology  Type 2 diabetes mellitus without complication, without long-term current use of insulin ([place]) Concern and keep a record of blood pressures no readings over 130/80 explained Lisinopril was for renal protection per Guidelines verbalizing understanding and will decrease lisinopril from 69m daily to 2.5 mg daily -      Need for immunization against influenza -     Flu Vaccine QUAD 36+ mos IM  Fear of parasites Per patient went out of country and his son was found to have parasites in his stool and would like to make sure he does not  -     Ova and Parasite Examination  Other orders -     Discontinue: lisinopril (ZESTRIL) 5 MG tablet; Take 1 tablet (5 mg total) by mouth daily. Changed to -Take 1/2 tablet daily renal protection. -     rosuvastatin (CRESTOR) 10 MG tablet; Take 1 tablet (10 mg total) by mouth daily. -     glipiZIDE (GLUCOTROL) 5 MG tablet; Take 1 tablet (5 mg total) by mouth 2 (two) times daily before a meal. -     omega-3 acid ethyl esters (LOVAZA) 1 g capsule; Take 1 capsule (1 g total) by mouth 2 (two) times daily. -     fluticasone (FLONASE)  50 MCG/ACT nasal spray; Place 2 sprays into both nostrils daily.    Follow-up: Return in about 6 months (around 08/25/2020) for fasting labs and T2D.    Kerin Perna, NP

## 2020-02-26 NOTE — Progress Notes (Signed)
linzess is expensive wants to know if there is another option Would like Rx for freestyle libre glucometer

## 2020-02-26 NOTE — Patient Instructions (Signed)

## 2020-02-27 DIAGNOSIS — R69 Illness, unspecified: Secondary | ICD-10-CM | POA: Diagnosis not present

## 2020-03-03 LAB — OVA AND PARASITE EXAMINATION

## 2020-03-05 ENCOUNTER — Other Ambulatory Visit (INDEPENDENT_AMBULATORY_CARE_PROVIDER_SITE_OTHER): Payer: Self-pay | Admitting: Primary Care

## 2020-03-05 DIAGNOSIS — N401 Enlarged prostate with lower urinary tract symptoms: Secondary | ICD-10-CM | POA: Diagnosis not present

## 2020-03-05 DIAGNOSIS — R351 Nocturia: Secondary | ICD-10-CM | POA: Diagnosis not present

## 2020-03-05 DIAGNOSIS — E119 Type 2 diabetes mellitus without complications: Secondary | ICD-10-CM

## 2020-03-05 DIAGNOSIS — N528 Other male erectile dysfunction: Secondary | ICD-10-CM | POA: Diagnosis not present

## 2020-03-05 MED ORDER — ONETOUCH VERIO VI STRP: ORAL_STRIP | 12 refills | Status: DC

## 2020-03-05 MED ORDER — ONETOUCH DELICA PLUS LANCET33G MISC: 1.0000 | Freq: Once | 3 refills | Status: AC

## 2020-03-09 ENCOUNTER — Telehealth (INDEPENDENT_AMBULATORY_CARE_PROVIDER_SITE_OTHER): Payer: Self-pay | Admitting: Primary Care

## 2020-03-09 NOTE — Telephone Encounter (Signed)
Patient requesting new rx to reflect as per the pharmacist "free style libre". Informed please allow 48 to 72 hour turn around time  [facility] - [place], [place] - [address] Phone:  [phone]  Fax:  [phone]

## 2020-03-09 NOTE — Telephone Encounter (Signed)
See pt's request below for new Rx for [employer].

## 2020-03-09 NOTE — Telephone Encounter (Signed)
Per initial encounter, "Patient requesting new rx to reflect as per the pharmacist "free style libre". Informed please allow 48 to 72 hour turn around time"  [facility] - [place], [place] - [address] Phone:  [phone]  Fax:  [phone]    The patient is seen at [facility]; will route to office for final disposition

## 2020-03-10 ENCOUNTER — Other Ambulatory Visit (INDEPENDENT_AMBULATORY_CARE_PROVIDER_SITE_OTHER): Payer: Self-pay | Admitting: Primary Care

## 2020-03-11 ENCOUNTER — Other Ambulatory Visit (INDEPENDENT_AMBULATORY_CARE_PROVIDER_SITE_OTHER): Payer: Self-pay | Admitting: Primary Care

## 2020-03-11 DIAGNOSIS — K5900 Constipation, unspecified: Secondary | ICD-10-CM

## 2020-03-11 MED ORDER — LUBIPROSTONE 24 MCG PO CAPS: 24.0000 ug | ORAL_CAPSULE | Freq: Two times a day (BID) | ORAL | 3 refills | Status: DC

## 2020-03-17 ENCOUNTER — Telehealth (INDEPENDENT_AMBULATORY_CARE_PROVIDER_SITE_OTHER): Payer: Self-pay | Admitting: Primary Care

## 2020-03-17 NOTE — Telephone Encounter (Signed)
Copied from CRM [id number]. Topic: General - Other >> Mar 11, 2020  2:02 PM Marylen Ponto wrote: Reason for CRM: Pt stated [employer] informed him that a fax was sent to the office regarding the Rx for Linzess. Pt stated Monia Pouch needs more details regarding the Rx and would like the form that was faxed to be completed and sent back.

## 2020-03-31 ENCOUNTER — Other Ambulatory Visit (INDEPENDENT_AMBULATORY_CARE_PROVIDER_SITE_OTHER): Payer: Self-pay | Admitting: Primary Care

## 2020-03-31 DIAGNOSIS — E119 Type 2 diabetes mellitus without complications: Secondary | ICD-10-CM

## 2020-04-03 ENCOUNTER — Encounter: Payer: Self-pay | Admitting: Gastroenterology

## 2020-04-04 DIAGNOSIS — R69 Illness, unspecified: Secondary | ICD-10-CM | POA: Diagnosis not present

## 2020-04-06 DIAGNOSIS — R69 Illness, unspecified: Secondary | ICD-10-CM | POA: Diagnosis not present

## 2020-04-07 ENCOUNTER — Other Ambulatory Visit (INDEPENDENT_AMBULATORY_CARE_PROVIDER_SITE_OTHER): Payer: Self-pay | Admitting: Primary Care

## 2020-04-08 ENCOUNTER — Encounter (INDEPENDENT_AMBULATORY_CARE_PROVIDER_SITE_OTHER): Payer: Self-pay | Admitting: Primary Care

## 2020-04-08 ENCOUNTER — Telehealth (INDEPENDENT_AMBULATORY_CARE_PROVIDER_SITE_OTHER): Payer: Self-pay | Admitting: Primary Care

## 2020-04-08 NOTE — Telephone Encounter (Signed)
Pt is calling and would like to know why the provider informed [facility] that he only check his BS  once a day. Pt is calling to let michelle know he checks his BS more than 3 times a day. Pt would like more testing strips and lancet to be sent to [facility] in [place]

## 2020-04-08 NOTE — Telephone Encounter (Signed)
Pt is calling back to check status of prescription. He is needing more testing stripes and lancets . He states he checks his BS more than 3 times a day and is needing more. He is requesting prescription be sent over to  [facility] [address] [place] - [address]  [address] [place] [address]  Phone: [phone] Fax: [phone]  Hours: Not open 24 hours      Please advise

## 2020-04-09 ENCOUNTER — Telehealth: Payer: Self-pay | Admitting: Primary Care

## 2020-04-09 ENCOUNTER — Encounter (INDEPENDENT_AMBULATORY_CARE_PROVIDER_SITE_OTHER): Payer: Self-pay | Admitting: Primary Care

## 2020-04-09 NOTE — Telephone Encounter (Signed)
teCopied from CRM [id number]. Topic: General - Other >> Apr 09, 2020  3:03 PM Marylen Ponto wrote: Reason for CRM: Pt stated he sent a [facility] message but has not received a reply. Pt requests call back

## 2020-04-10 NOTE — Telephone Encounter (Signed)
Sent to pcp

## 2020-04-26 ENCOUNTER — Other Ambulatory Visit (INDEPENDENT_AMBULATORY_CARE_PROVIDER_SITE_OTHER): Payer: Self-pay | Admitting: Primary Care

## 2020-04-26 DIAGNOSIS — E119 Type 2 diabetes mellitus without complications: Secondary | ICD-10-CM

## 2020-04-26 DIAGNOSIS — Z794 Long term (current) use of insulin: Secondary | ICD-10-CM

## 2020-04-27 ENCOUNTER — Telehealth: Payer: Self-pay | Admitting: *Deleted

## 2020-04-27 DIAGNOSIS — R351 Nocturia: Secondary | ICD-10-CM | POA: Diagnosis not present

## 2020-04-27 DIAGNOSIS — N401 Enlarged prostate with lower urinary tract symptoms: Secondary | ICD-10-CM | POA: Diagnosis not present

## 2020-04-27 NOTE — Telephone Encounter (Signed)
PCP did not see it to be fit based on his A1C of 6.7 to increase the quantity of test strips and testing daily. He was informed that a referral to an Endocrinologist was placed. Patient stated he does not wish to have this referral.   He states his blood sugars are 90 at [place] and have been as low as 67. He watches his diet so that his blood sugars do not exceed 200 in hopes to have better DM control.    Attempt to call patient back to  Explain this again and to inform that rx will not be sent to pharmacy at this time.

## 2020-04-29 ENCOUNTER — Other Ambulatory Visit (HOSPITAL_BASED_OUTPATIENT_CLINIC_OR_DEPARTMENT_OTHER): Payer: Self-pay | Admitting: Medical

## 2020-04-29 ENCOUNTER — Encounter (HOSPITAL_BASED_OUTPATIENT_CLINIC_OR_DEPARTMENT_OTHER): Payer: Self-pay

## 2020-04-29 ENCOUNTER — Other Ambulatory Visit: Payer: Self-pay

## 2020-04-29 ENCOUNTER — Emergency Department (HOSPITAL_BASED_OUTPATIENT_CLINIC_OR_DEPARTMENT_OTHER)
Admission: EM | Admit: 2020-04-29 | Discharge: 2020-04-29 | Disposition: A | Discharge status: Home / Self Care | Payer: Medicare HMO | Attending: Emergency Medicine | Admitting: Emergency Medicine

## 2020-04-29 DIAGNOSIS — Z7984 Long term (current) use of oral hypoglycemic drugs: Secondary | ICD-10-CM | POA: Diagnosis not present

## 2020-04-29 DIAGNOSIS — E119 Type 2 diabetes mellitus without complications: Secondary | ICD-10-CM | POA: Diagnosis not present

## 2020-04-29 DIAGNOSIS — I2510 Atherosclerotic heart disease of native coronary artery without angina pectoris: Secondary | ICD-10-CM | POA: Diagnosis not present

## 2020-04-29 DIAGNOSIS — Z760 Encounter for issue of repeat prescription: Secondary | ICD-10-CM | POA: Diagnosis not present

## 2020-04-29 DIAGNOSIS — Z79899 Other long term (current) drug therapy: Secondary | ICD-10-CM | POA: Diagnosis not present

## 2020-04-29 DIAGNOSIS — Z7982 Long term (current) use of aspirin: Secondary | ICD-10-CM | POA: Insufficient documentation

## 2020-04-29 DIAGNOSIS — I10 Essential (primary) hypertension: Secondary | ICD-10-CM | POA: Diagnosis not present

## 2020-04-29 LAB — CBG MONITORING, ED: Glucose-Capillary: 217 mg/dL — ABNORMAL HIGH (ref 70–99)

## 2020-04-29 MED ORDER — GLIPIZIDE 5 MG PO TABS: 5.0000 mg | ORAL_TABLET | Freq: Once | ORAL | 0 refills | Status: DC

## 2020-04-29 MED FILL — glipiZIDE 5 MG TABS: 5 | 1 days supply | Qty: 1 | Fill #0

## 2020-04-29 NOTE — ED Triage Notes (Signed)
Pt escorted to ED by [facility] officer x 2-officer states that pt is not able to enter residence at this time to get medications-pt states he needs "glipizide 5mg "-pt NAD-steady gait

## 2020-04-29 NOTE — Discharge Instructions (Addendum)
Please take printed prescription to the outpatient pharmacy
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
## 2020-05-13 ENCOUNTER — Other Ambulatory Visit: Payer: Self-pay

## 2020-05-14 ENCOUNTER — Ambulatory Visit (INDEPENDENT_AMBULATORY_CARE_PROVIDER_SITE_OTHER): Payer: Medicare HMO | Admitting: Family Medicine

## 2020-05-14 ENCOUNTER — Encounter: Payer: Self-pay | Admitting: Family Medicine

## 2020-05-14 VITALS — BP 130/72 | HR 95 | Temp 98.5°F | Ht 68.25 in | Wt 168.2 lb

## 2020-05-14 DIAGNOSIS — E1142 Type 2 diabetes mellitus with diabetic polyneuropathy: Secondary | ICD-10-CM | POA: Diagnosis not present

## 2020-05-14 DIAGNOSIS — R69 Illness, unspecified: Secondary | ICD-10-CM | POA: Diagnosis not present

## 2020-05-14 DIAGNOSIS — I10 Essential (primary) hypertension: Secondary | ICD-10-CM

## 2020-05-14 DIAGNOSIS — Z7689 Persons encountering health services in other specified circumstances: Secondary | ICD-10-CM

## 2020-05-14 LAB — POCT GLYCOSYLATED HEMOGLOBIN (HGB A1C): Hemoglobin A1C: 7.2 % — AB (ref 4.0–5.6)

## 2020-05-14 MED ORDER — ONETOUCH VERIO VI STRP: ORAL_STRIP | 12 refills | Status: DC

## 2020-05-14 MED ORDER — DAPAGLIFLOZIN PROPANEDIOL 10 MG PO TABS: 10.0000 mg | ORAL_TABLET | Freq: Every day | ORAL | 3 refills | Status: DC

## 2020-05-14 MED ORDER — ONETOUCH VERIO VI STRP: ORAL_STRIP | 12 refills | Status: AC

## 2020-05-14 NOTE — Patient Instructions (Addendum)
Continue the metformin 1000mg  2/xday Start farxiga 10mg  1 tab daily Stop PM dose of glipizide Cont with AM dose of glipizide 2.5mg  43mo follow-up

## 2020-05-14 NOTE — Progress Notes (Signed)
Jordan Bryant is a 64 y.o. male  Chief Complaint  Patient presents with   Establish Care    NP-establish care/DM.      HPI: Jordan Bryant is a 64 y.o. male here to establish care with our office and for routine f/u on DM, HTN. Previous PCP - Jordan Mire, NP at [facility].  For DM, pt takes metformin 1063m BID and glipizide 553mBID. In AM he takes metformin 100015mnd glipizide 2.5mg75mid day he takes glipizide 2.5mg,70md then at night he takes metformin 1000mg 62mglipizide 5mg. H38mhecks his BS at home - 125-160. He checks his BS multiple times per day and adjusts glipizde dose based on BS readings. He endorses episodes of hypoglycemia at least a few times per week, usually in early morning around 3-4am. For HTN, pt takes lisinopril 2.5mg dai15m He is also on crestor 10mg dai35mnd lovaza 1gm BID.   Lab Results  Component Value Date   HGBA1C 6.7 (A) 02/26/2020   Lab Results  Component Value Date   CHOL 112 09/11/2019   HDL 51 09/11/2019   LDLCALC 45 09/11/2019   TRIG 81 09/11/2019   CHOLHDL 2.2 09/11/2019   Lab Results  Component Value Date   NA 139 08/25/2019   K 3.6 08/25/2019   CREATININE 0.90 08/25/2019   GFRNONAA >60 08/25/2019   GFRAA >60 08/25/2019   GLUCOSE 244 (H) 08/25/2019   Lab Results  Component Value Date   LABMICR 6.1 02/06/2018   LABMICR 4.6 02/01/2017    Past Medical History:  Diagnosis Date   Asthma    Coronary artery disease    Diabetes mellitus without complication (HCC)    [place]    Stroke ([facility])    [facility]:  Procedure Laterality Date   Kidney Stones     PROSTATE SURGERY      Social History   Socioeconomic History   Marital status: Married    Spouse name: Not on file   Number of children: Not on file   Years of education: Not on file   Highest education level: Master's degree (e.g., MA, MS, MEng, MEd, MSW, MBA)  Occupational History    Comment: part time  Tobacco Use    Smoking status: Never Smoker   Smokeless tobacco: Never Used  Vaping Use   Vaping Use: Never used  Substance and Sexual Activity   Alcohol use: No   Drug use: No   Sexual activity: Yes  Other Topics Concern   Not on file  Social History Narrative   Lives with wife   Caffeine- one coffee/tea daily   Social Determinants of Health   Financial Resource Strain:    Difficulty of Paying Living Expenses: Not on file  Food Insecurity:    Worried About Running OCharity fundraiserast Year: Not on file   Ran Out o[employer]in the Last Year: Not on file  Transportation Needs:    Lack of Transportation (Medical): Not on file   Lack of Transportation (Non-Medical): Not on file  Physical Activity:    Days of Exercise per Week: Not on file   Minutes of Exercise per Session: Not on file  Stress:    Feeling of Stress : Not on file  Social Connections:    Frequency of Communication with Friends and Family: Not on file   Frequency of Social Gatherings with Friends and Family: Not on file   Attends Religious Services:  Not on file   Active Member of Clubs or Organizations: Not on file   Attends Club or Organization Meetings: Not on file   Marital Status: Not on file  Intimate Partner Violence:    Fear of Current or Ex-Partner: Not on file   Emotionally Abused: Not on file   Physically Abused: Not on file   Sexually Abused: Not on file    Family History  Problem Relation Age of Onset   Heart attack Mother    Heart attack Father    Colon cancer Neg Hx    Esophageal cancer Neg Hx    Liver cancer Neg Hx    Pancreatic cancer Neg Hx    Rectal cancer Neg Hx    Stomach cancer Neg Hx      Immunization History  Administered Date(s) Administered   Influenza,inj,Quad PF,6+ Mos 02/06/2018, 02/26/2020   Janssen (J&J) SARS-COV-2 Vaccination 10/29/2019   Pneumococcal Conjugate-13 02/01/2017   Pneumococcal Polysaccharide-23 02/06/2018   Tdap 02/01/2017     Outpatient Encounter Medications as of 05/14/2020  Medication Sig   aspirin 81 MG chewable tablet Chew by mouth daily.   fluocinonide ointment (LIDEX) 3.34 % APPLY ONE APPLICATION TOPICALLY TWICE DAILY   fluticasone (FLONASE) 50 MCG/ACT nasal spray Place 2 sprays into both nostrils daily.   glipiZIDE (GLUCOTROL) 5 MG tablet Take 1 tablet (5 mg total) by mouth 2 (two) times daily before a meal.   glucose blood (ONETOUCH VERIO) test strip Check BS 3x/day   Lancets MISC 1 each by Does not apply route 3 (three) times daily.   lisinopril (ZESTRIL) 5 MG tablet Take 0.5 tablets (2.5 mg total) by mouth daily.   metFORMIN (GLUCOPHAGE) 1000 MG tablet TAKE ONE TABLET BY MOUTH TWICE DAILY with meals   omega-3 acid ethyl esters (LOVAZA) 1 g capsule Take 1 capsule (1 g total) by mouth 2 (two) times daily.   OVER THE COUNTER MEDICATION Take 1 capsule by mouth in the morning and at bedtime. Berberry Extract   rosuvastatin (CRESTOR) 10 MG tablet Take 1 tablet (10 mg total) by mouth daily.   tamsulosin (FLOMAX) 0.4 MG CAPS capsule Take 0.4 mg by mouth daily.   [DISCONTINUED] glucose blood (ONETOUCH VERIO) test strip Check blood sugars once daily in AM fasting   [DISCONTINUED] glucose blood (ONETOUCH VERIO) test strip Check BS 3x/day   dapagliflozin propanediol (FARXIGA) 10 MG TABS tablet Take 1 tablet (10 mg total) by mouth daily.   glipiZIDE (GLUCOTROL) 5 MG tablet Take 1 tablet (5 mg total) by mouth once for 1 dose.   glucose monitoring kit (FREESTYLE) monitoring kit Take blood sugars once daily (Patient not taking: Reported on 05/14/2020)   ondansetron (ZOFRAN ODT) 4 MG disintegrating tablet 33m ODT q4 hours prn nausea/vomit (Patient not taking: Reported on 05/14/2020)   [DISCONTINUED] lubiprostone (AMITIZA) 24 MCG capsule Take 1 capsule (24 mcg total) by mouth 2 (two) times daily with a meal. (Patient not taking: Reported on 05/14/2020)   Facility-Administered Encounter Medications as of  05/14/2020  Medication   0.9 %  sodium chloride infusion     ROS: Pertinent positives and negatives noted in HPI. Remainder of ROS non-contributory   No Known Allergies  BP 130/72    Pulse 95    Temp 98.5 F (36.9 C) (Temporal)    Ht 5' 8.25" (1.734 m)    Wt 168 lb 3.2 oz (76.3 kg)    SpO2 96%    BMI 25.39 kg/m    BP Readings from  Last 3 Encounters:  05/14/20 130/72  04/29/20 (!) 153/88  02/26/20 133/80   Pulse Readings from Last 3 Encounters:  05/14/20 95  04/29/20 96  02/26/20 81   Wt Readings from Last 3 Encounters:  05/14/20 168 lb 3.2 oz (76.3 kg)  04/29/20 160 lb (72.6 kg)  02/26/20 167 lb 12.8 oz (76.1 kg)     Physical Exam Constitutional:      General: He is not in acute distress.    Appearance: Normal appearance. He is not ill-appearing.  Pulmonary:     Effort: No respiratory distress.  Neurological:     Mental Status: He is alert and oriented to person, place, and time.  Psychiatric:        Mood and Affect: Mood normal.        Behavior: Behavior normal.      A/P:  1. Encounter to establish care with new doctor  2. Type 2 diabetes mellitus with diabetic polyneuropathy, without long-term current use of insulin (HCC) - POCT HgB A1C = 7.2 (increased from 6.7 in 02/2020) - pt notes hypo and a few hyperglycemia episodes and more recently he has noted more fluctuations in BS readings in the past 2 mo. He has been adjusting his glipizide dose daily based on BS readings and I explained this is not ideal and I think we can achieve more stable BS readings with a different medication. He is very hesitant to completely stop glipizide but agrees to cut down to 2.92m qAM and will start farxiga 132mdaily (he wants to take qPM with dinner). He will cont metformin 100022mID. Rx: - dapagliflozin propanediol (FARXIGA) 10 MG TABS tablet; Take 1 tablet (10 mg total) by mouth daily.  Dispense: 90 tablet; Refill: 3 - Microalbumin / creatinine urine ratio Refill: - glucose  blood (ONETOUCH VERIO) test strip; Check BS 3x/day  Dispense: 100 each; Refill: 12 - f/u in 3 mo or sooner PRN - will need fasting labs at the OV (FLP, CMP, etc)  3. Essential hypertension - controlled, cont lisinopril 2.5mg44mily    This visit occurred during the SARS-CoV-2 public health emergency.  Safety protocols were in place, including screening questions prior to the visit, additional usage of staff PPE, and extensive cleaning of exam room while observing appropriate contact time as indicated for disinfecting solutions.

## 2020-05-15 LAB — MICROALBUMIN / CREATININE URINE RATIO
Creatinine,U: 117.7 mg/dL
Microalb Creat Ratio: 1.3 mg/g (ref 0.0–30.0)
Microalb, Ur: 1.5 mg/dL (ref 0.0–1.9)

## 2020-05-18 ENCOUNTER — Telehealth: Payer: Self-pay

## 2020-05-18 DIAGNOSIS — E119 Type 2 diabetes mellitus without complications: Secondary | ICD-10-CM

## 2020-05-18 DIAGNOSIS — Z794 Long term (current) use of insulin: Secondary | ICD-10-CM

## 2020-05-18 NOTE — Telephone Encounter (Signed)
Patient calling to ask if there is a different medication that he can take beside the [place], due to it being too expensive.  Pt would like a call back when the PCP decides.  CB# [phone]

## 2020-05-19 ENCOUNTER — Encounter: Payer: Self-pay | Admitting: Gastroenterology

## 2020-05-19 ENCOUNTER — Ambulatory Visit: Payer: Medicare HMO | Admitting: Gastroenterology

## 2020-05-19 VITALS — BP 112/68 | HR 85 | Ht 68.0 in | Wt 167.0 lb

## 2020-05-19 DIAGNOSIS — K5909 Other constipation: Secondary | ICD-10-CM

## 2020-05-19 NOTE — Patient Instructions (Signed)
If you are age 66 or older, your body mass index should be between 23-30. Your Body mass index is 25.39 kg/m. If this is out of the aforementioned range listed, please consider follow up with your Primary Care Provider.  If you are age 42 or younger, your body mass index should be between 19-25. Your Body mass index is 25.39 kg/m. If this is out of the aformentioned range listed, please consider follow up with your Primary Care Provider.   Medication Samples have been provided to the patient.  Drug name: Linzess       Strength:        Qty: 20  LOT: [place].Newer  Exp.Date: 10-2021  Stop the fiber supplement!    Take Docusate 200 mg once a day   Take one tablet of Senna each evening.   It was a pleasure to see you today!   Dr. Myrtie Neither

## 2020-05-19 NOTE — Progress Notes (Signed)
[facility] Progress Note  Chief Complaint: Chronic constipation  Subjective  History:  This is a very pleasant 64 year old man I know from prior colonoscopy.  He came to see me for constipation that has been a problem his whole life. Primary care prescribed Linzess, which worked reasonably well but was cost prohibitive.  Amitiza was prescribed but same problem.  Patient had also traveled out of the country and says his son was found to have a parasitic infection, so the patient requested to be checked at primary care visit mid-September.  Testing negative. Normal screening colonoscopy with me March 2019 Normal TSH oct 2020, nml Calcium level this year  His typical regimen is 2 fiber capsules 3 times a day with meals and a Senokot tablet every other day alternating with another colon cleanse remedy that he takes from [facility].  This usually works well, but if he does not have a bowel movement for a few days you will feel quite bloated and get a headache, so he will take a dose or 2 of Linzess which would then give him relief with passage of several formed stools.  Denies rectal bleeding. Eats a high-fiber diet with vegetables at nearly every meal, very few carbohydrates since he is diabetic  ROS: Cardiovascular:  no chest pain Respiratory: no dyspnea Remainder of systems negative except as above The patient's Past Medical, Family and Social History were reviewed and are on file in the EMR.  Objective:  Med list reviewed  Current Outpatient Medications:  .  aspirin 81 MG chewable tablet, Chew by mouth daily., Disp: , Rfl:  .  dapagliflozin propanediol (FARXIGA) 10 MG TABS tablet, Take 1 tablet (10 mg total) by mouth daily., Disp: 90 tablet, Rfl: 3 .  fluocinonide ointment (LIDEX) 0.05 %, APPLY ONE APPLICATION TOPICALLY TWICE DAILY, Disp: 30 g, Rfl: 0 .  fluticasone (FLONASE) 50 MCG/ACT nasal spray, Place 2 sprays into both nostrils daily., Disp: 16 g, Rfl: 6 .  glipiZIDE  (GLUCOTROL) 5 MG tablet, Take 1 tablet (5 mg total) by mouth 2 (two) times daily before a meal., Disp: 120 tablet, Rfl: 3 .  glucose blood (ONETOUCH VERIO) test strip, Check BS 3x/day, Disp: 100 each, Rfl: 12 .  glucose monitoring kit (FREESTYLE) monitoring kit, Take blood sugars once daily, Disp: 1 each, Rfl: 0 .  Lancets MISC, 1 each by Does not apply route 3 (three) times daily., Disp: 90 each, Rfl: 2 .  lisinopril (ZESTRIL) 5 MG tablet, Take 0.5 tablets (2.5 mg total) by mouth daily., Disp: 15 tablet, Rfl: 2 .  metFORMIN (GLUCOPHAGE) 1000 MG tablet, TAKE ONE TABLET BY MOUTH TWICE DAILY with meals, Disp: 180 tablet, Rfl: 0 .  omega-3 acid ethyl esters (LOVAZA) 1 g capsule, Take 1 capsule (1 g total) by mouth 2 (two) times daily., Disp: 120 capsule, Rfl: 1 .  ondansetron (ZOFRAN ODT) 4 MG disintegrating tablet, 56m ODT q4 hours prn nausea/vomit, Disp: 30 tablet, Rfl: 0 .  OVER THE COUNTER MEDICATION, Take 1 capsule by mouth in the morning and at bedtime. Berberry Extract, Disp: , Rfl:  .  rosuvastatin (CRESTOR) 10 MG tablet, Take 1 tablet (10 mg total) by mouth daily., Disp: 90 tablet, Rfl: 3 .  tamsulosin (FLOMAX) 0.4 MG CAPS capsule, Take 0.4 mg by mouth daily., Disp: , Rfl:  .  glipiZIDE (GLUCOTROL) 5 MG tablet, Take 1 tablet (5 mg total) by mouth once for 1 dose., Disp: 1 tablet, Rfl: 0  Current Facility-Administered Medications:  .  0.9 %  sodium chloride infusion, 500 mL, Intravenous, Once, Danis, Estill Cotta III, MD   Vital signs in last 24 hrs: Vitals:   05/19/20 1357  BP: 112/68  Pulse: 85    Physical Exam  Well-appearing  HEENT: sclera anicteric, oral mucosa moist without lesions  Neck: supple, no thyromegaly, JVD or lymphadenopathy  Cardiac: RRR without murmurs, S1S2 heard, no peripheral edema  Pulm: clear to auscultation bilaterally, normal RR and effort noted  Abdomen: soft, no tenderness, with active bowel sounds. No guarding or palpable hepatosplenomegaly.  Skin;  warm and dry, no jaundice or rash  Labs:  See above ___________________________________________ Radiologic studies:   ____________________________________________ Other:   _____________________________________________ Assessment & Plan  Assessment: Encounter Diagnosis  Name Primary?  . Chronic constipation Yes   Longstanding chronic constipation, suspect poor motility.  We note is not obstructed from prior colonoscopy.  I suspect he is consuming excess fiber supplements which may be making this somewhat worse. He already takes a daily magnesium supplement and other vitamins and supplements as well  Plan: Stop the additional fiber capsule since he already consumes a high-fiber diet Ducosate 200 mg every morning Senokot every evening Linzess 72 mcg as needed, samples were given No additional testing planned at present, see me as needed or contact our office for more advice.  30 minutes were spent on this encounter (including chart review, history/exam, counseling/coordination of care, and documentation)  Nelida Meuse III

## 2020-05-19 NOTE — Telephone Encounter (Signed)
Please review and advise on message below. Thanks.  Dm/cma  

## 2020-05-20 DIAGNOSIS — R69 Illness, unspecified: Secondary | ICD-10-CM | POA: Diagnosis not present

## 2020-05-20 MED ORDER — PIOGLITAZONE HCL 30 MG PO TABS: 30.0000 mg | ORAL_TABLET | Freq: Every day | ORAL | 3 refills | Status: DC

## 2020-05-20 MED ORDER — LANCETS MISC: 1.0000 | Freq: Three times a day (TID) | 2 refills | Status: AC

## 2020-05-20 NOTE — Addendum Note (Signed)
Addended by: Overton Mam on: 05/20/2020 12:46 PM   Modules accepted: Orders

## 2020-05-20 NOTE — Telephone Encounter (Signed)
Many other meds also appear to be tier 3 (same as farxiga) but actos is tier 1 so should be cost effective. Rx sent for actos 30mg  1 tab daily. farxiga removed from pts active med list

## 2020-05-20 NOTE — Telephone Encounter (Signed)
Pt called stating lancets were not sent in. Please send lancets to [facility].  Pt also is asking about what time of day to take Actos and if he needs to change when he takes the other diabetes medications that he is on.  Please call pt to advise. Ph# [phone]

## 2020-05-20 NOTE — Telephone Encounter (Signed)
Patient notified VIA phone.  No questions.  Dm/cma ? ?

## 2020-05-20 NOTE — Addendum Note (Signed)
Addended by: Waymond Cera on: 05/20/2020 03:21 PM   Modules accepted: Orders

## 2020-05-22 DIAGNOSIS — R69 Illness, unspecified: Secondary | ICD-10-CM | POA: Diagnosis not present

## 2020-05-28 ENCOUNTER — Encounter: Payer: Self-pay | Admitting: Family Medicine

## 2020-05-28 ENCOUNTER — Other Ambulatory Visit: Payer: Self-pay | Admitting: Family Medicine

## 2020-06-22 ENCOUNTER — Other Ambulatory Visit (INDEPENDENT_AMBULATORY_CARE_PROVIDER_SITE_OTHER): Payer: Self-pay | Admitting: Family Medicine

## 2020-06-22 DIAGNOSIS — E119 Type 2 diabetes mellitus without complications: Secondary | ICD-10-CM

## 2020-07-20 ENCOUNTER — Other Ambulatory Visit (INDEPENDENT_AMBULATORY_CARE_PROVIDER_SITE_OTHER): Payer: Self-pay | Admitting: Primary Care

## 2020-07-28 ENCOUNTER — Other Ambulatory Visit: Payer: Self-pay

## 2020-07-28 ENCOUNTER — Telehealth: Payer: Self-pay | Admitting: Family Medicine

## 2020-07-28 NOTE — Telephone Encounter (Signed)
ERROR

## 2020-07-29 ENCOUNTER — Ambulatory Visit (INDEPENDENT_AMBULATORY_CARE_PROVIDER_SITE_OTHER): Payer: Medicare HMO | Admitting: Family Medicine

## 2020-07-29 ENCOUNTER — Encounter: Payer: Self-pay | Admitting: Family Medicine

## 2020-07-29 ENCOUNTER — Ambulatory Visit (INDEPENDENT_AMBULATORY_CARE_PROVIDER_SITE_OTHER): Payer: Medicare HMO

## 2020-07-29 VITALS — BP 122/70 | HR 81 | Temp 98.1°F | Ht 68.0 in | Wt 170.0 lb

## 2020-07-29 DIAGNOSIS — I10 Essential (primary) hypertension: Secondary | ICD-10-CM

## 2020-07-29 DIAGNOSIS — K5900 Constipation, unspecified: Secondary | ICD-10-CM | POA: Diagnosis not present

## 2020-07-29 DIAGNOSIS — R200 Anesthesia of skin: Secondary | ICD-10-CM

## 2020-07-29 DIAGNOSIS — M47816 Spondylosis without myelopathy or radiculopathy, lumbar region: Secondary | ICD-10-CM | POA: Diagnosis not present

## 2020-07-29 LAB — VITAMIN B12: Vitamin B-12: 522 pg/mL (ref 211–911)

## 2020-07-29 MED ORDER — LISINOPRIL 2.5 MG PO TABS: 2.5000 mg | ORAL_TABLET | Freq: Every day | ORAL | 3 refills | Status: AC

## 2020-07-29 MED ORDER — LINACLOTIDE 72 MCG PO CAPS: 72.0000 ug | ORAL_CAPSULE | Freq: Every day | ORAL | 5 refills | Status: DC

## 2020-07-29 NOTE — Progress Notes (Signed)
 Jordan Bryant is a 65 y.o. male  Chief Complaint  Patient presents with  . Follow-up    F/u meds and c/o having numbness in LT leg x 2 months off/on when standing for more than 20 minutes.      HPI: Jordan Bryant is a 65 y.o. male who complains of numbness in Lt upper leg/thigh leg intermittent x 2 mo. He states rarely does it extend into the lower leg. Symptoms occur when he has been standing for more than about 20 min. No issues seated or walking. No back pain. No leg pain. No weakness. No edema. No issues with balance/coordination.   He would like B12 level checked and also ABI to check for intermittent claudication. Daughter is IM physician.  He has a h/o TIA in about 2015 and a few years later.   Pt has a PMHx significant for DM. Lab Results  Component Value Date   HGBA1C 7.2 (A) 05/14/2020   Lab Results  Component Value Date   CHOL 112 09/11/2019   HDL 51 09/11/2019   LDLCALC 45 09/11/2019   TRIG 81 09/11/2019   CHOLHDL 2.2 09/11/2019   He requests refills of lisinopril 2.5mg daily and linzess 72mg daily.   Past Medical History:  Diagnosis Date  . Asthma   . Coronary artery disease   . Diabetes mellitus without complication (HCC)   . Kidney stone   . Stroke (HCC)     Past Surgical History:  Procedure Laterality Date  . Kidney Stones    . PROSTATE SURGERY      Social History   Socioeconomic History  . Marital status: Married    Spouse name: Not on file  . Number of children: Not on file  . Years of education: Not on file  . Highest education level: Master's degree (e.g., MA, MS, MEng, MEd, MSW, MBA)  Occupational History    Comment: part time  Tobacco Use  . Smoking status: Never Smoker  . Smokeless tobacco: Never Used  Vaping Use  . Vaping Use: Never used  Substance and Sexual Activity  . Alcohol use: No  . Drug use: No  . Sexual activity: Yes  Other Topics Concern  . Not on file  Social History Narrative   Lives with wife   Caffeine- one  coffee/tea daily   Social Determinants of Health   Financial Resource Strain: Not on file  Food Insecurity: Not on file  Transportation Needs: Not on file  Physical Activity: Not on file  Stress: Not on file  Social Connections: Not on file  Intimate Partner Violence: Not on file    Family History  Problem Relation Age of Onset  . Heart attack Mother   . Heart attack Father   . Colon cancer Neg Hx   . Esophageal cancer Neg Hx   . Liver cancer Neg Hx   . Pancreatic cancer Neg Hx   . Rectal cancer Neg Hx   . Stomach cancer Neg Hx      Immunization History  Administered Date(s) Administered  . Influenza,inj,Quad PF,6+ Mos 02/06/2018, 02/26/2020  . Janssen (J&J) SARS-COV-2 Vaccination 10/29/2019  . Pneumococcal Conjugate-13 02/01/2017  . Pneumococcal Polysaccharide-23 02/06/2018  . Tdap 02/01/2017    Outpatient Encounter Medications as of 07/29/2020  Medication Sig  . aspirin 81 MG chewable tablet Chew by mouth daily.  . fluocinonide ointment (LIDEX) 0.05 % APPLY ONE APPLICATION TOPICALLY TWICE DAILY  . fluticasone (FLONASE) 50 MCG/ACT nasal spray Place 2 sprays into both   nostrils daily.  . glipiZIDE (GLUCOTROL) 5 MG tablet Take 1 tablet (5 mg total) by mouth 2 (two) times daily before a meal.  . glucose blood (ONETOUCH VERIO) test strip Check BS 3x/day  . glucose monitoring kit (FREESTYLE) monitoring kit Take blood sugars once daily  . Lancets MISC 1 each by Does not apply route 3 (three) times daily.  . metFORMIN (GLUCOPHAGE) 1000 MG tablet TAKE ONE TABLET BY MOUTH TWICE DAILY WITH MEALS  . omega-3 acid ethyl esters (LOVAZA) 1 g capsule Take 1 capsule (1 g total) by mouth 2 (two) times daily.  . OVER THE COUNTER MEDICATION Take 1 capsule by mouth in the morning and at bedtime. Berberry Extract  . rosuvastatin (CRESTOR) 10 MG tablet TAKE ONE TABLET BY MOUTH ONE TIME DAILY  . tamsulosin (FLOMAX) 0.4 MG CAPS capsule Take 0.4 mg by mouth daily.  . [DISCONTINUED] lisinopril  (ZESTRIL) 5 MG tablet Take 0.5 tablets (2.5 mg total) by mouth daily.  . lisinopril (ZESTRIL) 2.5 MG tablet Take 1 tablet (2.5 mg total) by mouth daily.  . pioglitazone (ACTOS) 30 MG tablet Take 1 tablet (30 mg total) by mouth daily. (Patient not taking: Reported on 07/29/2020)  . [DISCONTINUED] ondansetron (ZOFRAN ODT) 4 MG disintegrating tablet 4mg ODT q4 hours prn nausea/vomit (Patient not taking: Reported on 07/29/2020)   Facility-Administered Encounter Medications as of 07/29/2020  Medication  . 0.9 %  sodium chloride infusion     ROS: Pertinent positives and negatives noted in HPI. Remainder of ROS non-contributory  No Known Allergies  BP 122/70   Pulse 81   Temp 98.1 F (36.7 C) (Temporal)   Ht 5' 8" (1.727 m)   Wt 170 lb (77.1 kg)   SpO2 95%   BMI 25.85 kg/m   Wt Readings from Last 3 Encounters:  07/29/20 170 lb (77.1 kg)  05/19/20 167 lb (75.8 kg)  05/14/20 168 lb 3.2 oz (76.3 kg)   Temp Readings from Last 3 Encounters:  07/29/20 98.1 F (36.7 C) (Temporal)  05/14/20 98.5 F (36.9 C) (Temporal)  04/29/20 98.7 F (37.1 C) (Oral)   BP Readings from Last 3 Encounters:  07/29/20 122/70  05/19/20 112/68  05/14/20 130/72   Pulse Readings from Last 3 Encounters:  07/29/20 81  05/19/20 85  05/14/20 95     Physical Exam Constitutional:      General: He is not in acute distress.    Appearance: Normal appearance. He is normal weight. He is not ill-appearing.  Musculoskeletal:     Lumbar back: No swelling, edema, spasms, tenderness or bony tenderness. Normal range of motion.     Left hip: No tenderness, bony tenderness or crepitus. Normal range of motion. Normal strength.     Right lower leg: No edema.     Left lower leg: No edema.  Neurological:     Mental Status: He is alert and oriented to person, place, and time.     Sensory: No sensory deficit.     Motor: Motor function is intact. No weakness.     Coordination: Coordination normal.     Gait: Gait normal.      Deep Tendon Reflexes: Reflexes normal.  Psychiatric:        Mood and Affect: Mood normal.        Behavior: Behavior normal.      A/P:  1. Essential hypertension - controlled, at goal Refill: - lisinopril (ZESTRIL) 2.5 MG tablet; Take 1 tablet (2.5 mg total) by mouth daily.  Dispense:   90 tablet; Refill: 3  2. Numbness in left leg - only occurs when standing for more than 15-20 min and involves Lt thigh. No pain, edema. No symptoms when sitting even for a long period of time, laying, or walking. Pt exercises at the gym 3 days per wk w/o issue - Vitamin B12 - DG Lumbar Spine Complete - daughter has advised pt to ask for ABI but symptoms and HPI not c/w intermittent claudication  3. Constipation, unspecified constipation type - chronic, at baseline Refill: - linaclotide (LINZESS) 72 MCG capsule; Take 1 capsule (72 mcg total) by mouth daily before breakfast.  Dispense: 30 capsule; Refill: 5    This visit occurred during the SARS-CoV-2 public health emergency.  Safety protocols were in place, including screening questions prior to the visit, additional usage of staff PPE, and extensive cleaning of exam room while observing appropriate contact time as indicated for disinfecting solutions.  

## 2020-07-29 NOTE — Patient Instructions (Signed)
I do not think your symptoms are due to intermittent claudication (IC) since they occur only when standing still. IC is muscle pain that occurs with or after activity.

## 2020-07-31 ENCOUNTER — Other Ambulatory Visit: Payer: Self-pay | Admitting: Family Medicine

## 2020-07-31 DIAGNOSIS — R200 Anesthesia of skin: Secondary | ICD-10-CM

## 2020-08-11 ENCOUNTER — Telehealth: Payer: Self-pay

## 2020-08-11 NOTE — Telephone Encounter (Signed)
Pt called 08/11/20 to schedule a lab appt for A1c, cholesterol, Iron, Testosterone. I did not see any lab orders but I did see where you wanted to see him 3 months from his last ov in 12/21. I tried to schedule a f/u visit but he said he did not need to see you he just needed lab done.  Please advise. Thank you!

## 2020-08-12 DIAGNOSIS — H903 Sensorineural hearing loss, bilateral: Secondary | ICD-10-CM | POA: Diagnosis not present

## 2020-08-12 NOTE — Telephone Encounter (Signed)
He can come in for lab appt A1C, lipid panel but really needs appt to discuss need for iron panel, testosterone, etc. I think it best to make f/u appt so we can discuss all labs he would like done

## 2020-08-12 NOTE — Telephone Encounter (Signed)
Does patient need a 3 month f/u appt or can he just schedule a lab appt to have his done again? Please advise.  Thanks.  Dm/cma

## 2020-08-12 NOTE — Telephone Encounter (Signed)
Patient notified VIA phone and scheduled for an appt on Friday. Dm/cma

## 2020-08-13 ENCOUNTER — Other Ambulatory Visit: Payer: Self-pay

## 2020-08-14 ENCOUNTER — Ambulatory Visit (INDEPENDENT_AMBULATORY_CARE_PROVIDER_SITE_OTHER): Payer: Medicare HMO | Admitting: Family Medicine

## 2020-08-14 ENCOUNTER — Encounter: Payer: Self-pay | Admitting: Family Medicine

## 2020-08-14 VITALS — BP 118/70 | HR 89 | Temp 98.0°F | Wt 169.0 lb

## 2020-08-14 DIAGNOSIS — Z1321 Encounter for screening for nutritional disorder: Secondary | ICD-10-CM | POA: Diagnosis not present

## 2020-08-14 DIAGNOSIS — M899 Disorder of bone, unspecified: Secondary | ICD-10-CM | POA: Diagnosis not present

## 2020-08-14 DIAGNOSIS — E119 Type 2 diabetes mellitus without complications: Secondary | ICD-10-CM | POA: Diagnosis not present

## 2020-08-14 DIAGNOSIS — Z8349 Family history of other endocrine, nutritional and metabolic diseases: Secondary | ICD-10-CM | POA: Diagnosis not present

## 2020-08-14 DIAGNOSIS — I10 Essential (primary) hypertension: Secondary | ICD-10-CM | POA: Diagnosis not present

## 2020-08-14 NOTE — Progress Notes (Signed)
Chief Complaint  Patient presents with  . Follow-up    Follow up patient would like to discuss labs.    HPI: *Jordan Bryant is a 65 y.o. male here for DM  follow-up. For DM, pt takes metformin 1034m BID and glipizide 559mBID. He also takes lisinopril 2.59m71maily and crestor 6m47mily.  Pt does  check BS at home. Readings: 120-140 Hypoglycemia/Hypergylcemic episodes: no  Lab Results  Component Value Date   HGBA1C 7.2 (A) 05/14/2020   Lab Results  Component Value Date   MICROALBUR 1.5 05/14/2020   Lab Results  Component Value Date   CREATININE 0.90 08/25/2019   Lab Results  Component Value Date   CHOL 112 09/11/2019   HDL 51 09/11/2019   LDLCALC 45 09/11/2019   TRIG 81 09/11/2019   CHOLHDL 2.2 09/11/2019    The ASCVD Risk score (GofMikey BussingJr., et al., 2013) failed to calculate for the following reasons:   The patient has a prior MI or stroke diagnosis  Pt is also requesting labs   Past Medical History:  Diagnosis Date  . Asthma   . Coronary artery disease   . Diabetes mellitus without complication (HCC)[place]. Kidney stone   [facility]  Past Surgical History:  Procedure Laterality Date  . Kidney Stones    . PROSTATE SURGERY      Social History   Socioeconomic History  . Marital status: Married    Spouse name: Not on file  . Number of children: Not on file  . Years of education: Not on file  . Highest education level: Master's degree (e.g., MA, MS, MEng, MEd, MSW, MBA)  Occupational History    Comment: part time  Tobacco Use  . Smoking status: Never Smoker  . Smokeless tobacco: Never Used  Vaping Use  . Vaping Use: Never used  Substance and Sexual Activity  . Alcohol use: No  . Drug use: No  . Sexual activity: Yes  Other Topics Concern  . Not on file  Social History Narrative   Lives with wife   Caffeine- one coffee/tea daily   Social Determinants of Health   Financial Resource Strain: Not on file  Food Insecurity: Not on file   Transportation Needs: Not on file  Physical Activity: Not on file  Stress: Not on file  Social Connections: Not on file  Intimate Partner Violence: Not on file    Family History  Problem Relation Age of Onset  . Heart attack Mother   . Heart attack Father   . Colon cancer Neg Hx   . Esophageal cancer Neg Hx   . Liver cancer Neg Hx   . Pancreatic cancer Neg Hx   . Rectal cancer Neg Hx   . Stomach cancer Neg Hx      Immunization History  Administered Date(s) Administered  . Influenza,inj,Quad PF,6+ Mos 02/06/2018, 02/26/2020  . Janssen (J&J) SARS-COV-2 Vaccination 10/29/2019  . Moderna Sars-Covid-2 Vaccination 05/27/2020  . Pneumococcal Conjugate-13 02/01/2017  . Pneumococcal Polysaccharide-23 02/06/2018  . Tdap 02/01/2017    Outpatient Encounter Medications as of 08/14/2020  Medication Sig  . aspirin 81 MG chewable tablet Chew by mouth daily.  . fluocinonide ointment (LIDEX) 0.058.63PPLY ONE APPLICATION TOPICALLY TWICE DAILY  . glipiZIDE (GLUCOTROL) 5 MG tablet Take 1 tablet (5 mg total) by mouth 2 (two) times daily before a meal.  . glucose blood (ONETOUCH VERIO) test strip Check BS 3x/day  . glucose  monitoring kit (FREESTYLE) monitoring kit Take blood sugars once daily  . Lancets MISC 1 each by Does not apply route 3 (three) times daily.  Marland Kitchen linaclotide (LINZESS) 72 MCG capsule Take 1 capsule (72 mcg total) by mouth daily before breakfast.  . lisinopril (ZESTRIL) 2.5 MG tablet Take 1 tablet (2.5 mg total) by mouth daily.  . metFORMIN (GLUCOPHAGE) 1000 MG tablet TAKE ONE TABLET BY MOUTH TWICE DAILY WITH MEALS  . omega-3 acid ethyl esters (LOVAZA) 1 g capsule Take 1 capsule (1 g total) by mouth 2 (two) times daily.  Marland Kitchen OVER THE COUNTER MEDICATION Take 1 capsule by mouth in the morning and at bedtime. Berberry Extract  . rosuvastatin (CRESTOR) 10 MG tablet TAKE ONE TABLET BY MOUTH ONE TIME DAILY  . tamsulosin (FLOMAX) 0.4 MG CAPS capsule Take 0.4 mg by mouth daily.  .  fluticasone (FLONASE) 50 MCG/ACT nasal spray Place 2 sprays into both nostrils daily. (Patient not taking: Reported on 08/14/2020)   Facility-Administered Encounter Medications as of 08/14/2020  Medication  . 0.9 %  sodium chloride infusion    ROS: Pertinent positives and negatives noted in HPI. Remainder of ROS non-contributory   No Known Allergies  BP 118/70   Pulse 89   Temp 98 F (36.7 C) (Temporal)   Wt 169 lb (76.7 kg)   SpO2 95%   BMI 25.70 kg/m   Wt Readings from Last 3 Encounters:  08/14/20 169 lb (76.7 kg)  07/29/20 170 lb (77.1 kg)  05/19/20 167 lb (75.8 kg)   Temp Readings from Last 3 Encounters:  08/14/20 98 F (36.7 C) (Temporal)  07/29/20 98.1 F (36.7 C) (Temporal)  05/14/20 98.5 F (36.9 C) (Temporal)   BP Readings from Last 3 Encounters:  08/14/20 118/70  07/29/20 122/70  05/19/20 112/68   Pulse Readings from Last 3 Encounters:  08/14/20 89  07/29/20 81  05/19/20 85     Physical Exam Constitutional:      General: He is not in acute distress.    Appearance: Normal appearance. He is not ill-appearing.  Cardiovascular:     Rate and Rhythm: Normal rate and regular rhythm.     Pulses: Normal pulses.     Heart sounds: Normal heart sounds.  Pulmonary:     Effort: Pulmonary effort is normal.     Breath sounds: Normal breath sounds. No wheezing or rhonchi.  Musculoskeletal:     Right lower leg: No edema.     Left lower leg: No edema.  Neurological:     Mental Status: He is alert.  Psychiatric:        Mood and Affect: Mood normal.        Behavior: Behavior normal.      A/P:  1. Type 2 diabetes mellitus without complication, without long-term current use of insulin (HCC) Lab Results  Component Value Date   HGBA1C 7.2 (A) 05/14/2020  - home FBS 120-140 - cont metformin 1011m BID and glipizide 562mBID - cont crestor 1010maily, lovaza 1gm BID - Lipid panel - Hemoglobin A1c - f/u in 17mo27mo Essential hypertension - controlled on  zestril 2.5mg 47mly - CBC - Comprehensive metabolic panel  3. Encounter for vitamin deficiency screening - VITAMIN D 25 Hydroxy (Vit-D Deficiency, Fractures)  4. Family history of thyroid disease in sister - TSH   This visit occurred during the SARS-CoV-2 public health emergency.  Safety protocols were in place, including screening questions prior to the visit, additional usage of staff PPE,  and extensive cleaning of exam room while observing appropriate contact time as indicated for disinfecting solutions.

## 2020-08-15 LAB — LIPID PANEL
Chol/HDL Ratio: 2.7 ratio (ref 0.0–5.0)
Cholesterol, Total: 130 mg/dL (ref 100–199)
HDL: 49 mg/dL (ref >39)
LDL Chol Calc (NIH): 52 mg/dL (ref 0–99)
Triglycerides: 172 mg/dL — ABNORMAL HIGH (ref 0–149)
VLDL Cholesterol Cal: 29 mg/dL (ref 5–40)

## 2020-08-15 LAB — COMPREHENSIVE METABOLIC PANEL
ALT: 10 IU/L (ref 0–44)
AST: 19 IU/L (ref 0–40)
Albumin/Globulin Ratio: 1.7 (ref 1.2–2.2)
Albumin: 4.6 g/dL (ref 3.8–4.8)
Alkaline Phosphatase: 57 IU/L (ref 44–121)
BUN/Creatinine Ratio: 18 (ref 10–24)
BUN: 19 mg/dL (ref 8–27)
Bilirubin Total: 0.3 mg/dL (ref 0.0–1.2)
CO2: 19 mmol/L — ABNORMAL LOW (ref 20–29)
Calcium: 9.1 mg/dL (ref 8.6–10.2)
Chloride: 102 mmol/L (ref 96–106)
Creatinine, Ser: 1.04 mg/dL (ref 0.76–1.27)
Globulin, Total: 2.7 g/dL (ref 1.5–4.5)
Glucose: 205 mg/dL — ABNORMAL HIGH (ref 65–99)
Potassium: 5.4 mmol/L — ABNORMAL HIGH (ref 3.5–5.2)
Sodium: 137 mmol/L (ref 134–144)
Total Protein: 7.3 g/dL (ref 6.0–8.5)
eGFR: 79 mL/min/{1.73_m2} (ref >59)

## 2020-08-15 LAB — CBC
Hematocrit: 43.5 % (ref 37.5–51.0)
Hemoglobin: 14.6 g/dL (ref 13.0–17.7)
MCH: 27.8 pg (ref 26.6–33.0)
MCHC: 33.6 g/dL (ref 31.5–35.7)
MCV: 83 fL (ref 79–97)
Platelets: 206 10*3/uL (ref 150–450)
RBC: 5.25 x10E6/uL (ref 4.14–5.80)
RDW: 13 % (ref 11.6–15.4)
WBC: 7.4 10*3/uL (ref 3.4–10.8)

## 2020-08-15 LAB — HEMOGLOBIN A1C
Est. average glucose Bld gHb Est-mCnc: 160 mg/dL
Hgb A1c MFr Bld: 7.2 % — ABNORMAL HIGH (ref 4.8–5.6)

## 2020-08-15 LAB — VITAMIN D 25 HYDROXY (VIT D DEFICIENCY, FRACTURES): Vit D, 25-Hydroxy: 44.5 ng/mL (ref 30.0–100.0)

## 2020-08-15 LAB — TSH: TSH: 0.565 u[IU]/mL (ref 0.450–4.500)

## 2020-08-20 ENCOUNTER — Encounter: Payer: Self-pay | Admitting: Family Medicine

## 2020-08-31 ENCOUNTER — Institutional Professional Consult (permissible substitution): Payer: Medicare HMO | Admitting: Diagnostic Neuroimaging

## 2020-08-31 ENCOUNTER — Telehealth: Payer: Self-pay | Admitting: *Deleted

## 2020-08-31 NOTE — Telephone Encounter (Signed)
Patient was no-show for appointment today 

## 2020-09-03 DIAGNOSIS — R351 Nocturia: Secondary | ICD-10-CM | POA: Diagnosis not present

## 2020-09-03 DIAGNOSIS — N401 Enlarged prostate with lower urinary tract symptoms: Secondary | ICD-10-CM | POA: Diagnosis not present

## 2020-09-14 ENCOUNTER — Other Ambulatory Visit (INDEPENDENT_AMBULATORY_CARE_PROVIDER_SITE_OTHER): Payer: Self-pay | Admitting: Primary Care

## 2020-09-14 ENCOUNTER — Other Ambulatory Visit (INDEPENDENT_AMBULATORY_CARE_PROVIDER_SITE_OTHER): Payer: Self-pay | Admitting: Family Medicine

## 2020-09-14 DIAGNOSIS — E119 Type 2 diabetes mellitus without complications: Secondary | ICD-10-CM

## 2020-09-28 ENCOUNTER — Encounter: Payer: Self-pay | Admitting: Diagnostic Neuroimaging

## 2020-09-28 ENCOUNTER — Ambulatory Visit: Payer: Medicare HMO | Admitting: Diagnostic Neuroimaging

## 2020-09-28 VITALS — BP 122/80 | HR 92 | Ht 68.0 in | Wt 169.0 lb

## 2020-09-28 DIAGNOSIS — G5712 Meralgia paresthetica, left lower limb: Secondary | ICD-10-CM

## 2020-09-28 NOTE — Patient Instructions (Signed)
  LEFT LEG NUMBNESS (intermittent; likely mild meralgia paresthetica) - worse with prolonged standing (trying to avoid static posture) - encouraged to continue walking, stretching, exercises

## 2020-09-28 NOTE — Progress Notes (Signed)
[facility]  PATIENT: Jordan Bryant DOB: 10/17/1955  REFERRING CLINICIAN: Ronnald Nian, DO HISTORY FROM: patient  REASON FOR VISIT: new consult     HISTORICAL  CHIEF COMPLAINT:  Chief Complaint  Patient presents with  . Numbness    Rm 6  "numbness in upper left leg when standing or walking; both hands and feet are very hot when I go to bed"    HISTORY OF PRESENT ILLNESS:   UPDATE (09/28/20, VRP): 65 year old male here for evaluation of transient left anterolateral thigh numbness.  Symptoms have been going on for the past 7 to 8 months, 2-3 times a week, lasting 15 to 30 minutes at a time.  These typically occur after he is standing in a static position for more than 20 minutes at a time.  He does not have problem when he is physically active and goes for a walk.  Sitting, bending or laying down does not trigger the symptoms.  Separately has some episodes when he lays down at night where he feels heat sensation in bilateral hands and feet.  This is quite intermittent.  PRIOR HPI: 65 year old male here for evaluation of TIA.  History of hypercholesterolemia and diabetes.  2015 patient was in [place] when all of a sudden he lost vision, had blurred vision, could not speak, symptoms lasted for 2 to 3 hours.  Patient was taken to the hospital and had TIA/stroke evaluation.  He was diagnosed with TIA.  Symptoms fully resolved.  February 2020 patient was in [place] when all of a sudden he did not feel well, felt dizzy, had a generalized weakness.  He was taken to local emergency room for evaluation.  He was found to be hypertensive 170/90.  Possible TIA diagnosis was raised.  EKG and lab testing were otherwise unremarkable.  He deferred any other testing at that time until he could come back to the [place].  Symptoms fully resolved after 1 or 2 hours.  Today patient feels well.  No residual symptoms.  He is concerned about possibility of TIA or stroke risk.   He has 2 children who are both physicians, living and working in [place] and [place].  They suggested he also follow-up with a neurologist locally.  Patient is trained as an Forensic psychologist.  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   REVIEW OF SYSTEMS: Full 14 system review of systems performed and negative with exception of: As per HPI.   ALLERGIES: No Known Allergies  HOME MEDICATIONS: Outpatient Medications Prior to Visit  Medication Sig Dispense Refill  . aspirin 81 MG chewable tablet Chew by mouth daily.    Marland Kitchen glipiZIDE (GLUCOTROL) 5 MG tablet Take 1 tablet (5 mg total) by mouth 2 (two) times daily before a meal. 120 tablet 3  . glucose blood (ONETOUCH VERIO) test strip Check BS 3x/day 100 each 12  . glucose monitoring kit (FREESTYLE) monitoring kit Take blood sugars once daily 1 each 0  . Lancets MISC 1 each by Does not apply route 3 (three) times daily. 90 each 2  . linaclotide (LINZESS) 72 MCG capsule Take 1 capsule (72 mcg total) by mouth daily before breakfast. 30 capsule 5  . lisinopril (ZESTRIL) 2.5 MG tablet Take 1 tablet (2.5 mg total) by mouth daily. 90 tablet 3  . metFORMIN (GLUCOPHAGE) 1000 MG tablet Take 1 tablet by mouth twice daily with meals 180 tablet 1  . omega-3 acid ethyl esters (  LOVAZA) 1 g capsule Take 1 capsule (1 g total) by mouth 2 (two) times daily. 120 capsule 1  . rosuvastatin (CRESTOR) 10 MG tablet TAKE ONE TABLET BY MOUTH ONE TIME DAILY 90 tablet 1  . tamsulosin (FLOMAX) 0.4 MG CAPS capsule Take 0.4 mg by mouth daily.    . fluocinonide ointment (LIDEX) 1.21 % APPLY ONE APPLICATION TOPICALLY TWICE DAILY (Patient not taking: Reported on 09/28/2020) 30 g 0  . fluticasone (FLONASE) 50 MCG/ACT nasal spray Place 2 sprays into both nostrils daily. (Patient not taking: No sig reported) 16 g 6  . OVER THE COUNTER MEDICATION Take 1 capsule by mouth in the morning and at bedtime. Berberry Extract      Facility-Administered Medications Prior to Visit  Medication Dose Route Frequency Provider Last Rate Last Admin  . 0.9 %  sodium chloride infusion  500 mL Intravenous Once Doran Stabler, MD        PAST MEDICAL HISTORY: Past Medical History:  Diagnosis Date  . Asthma   . Coronary artery disease   . Diabetes mellitus without complication ([place])   . Kidney stone   . Stroke [facility])     PAST SURGICAL HISTORY: Past Surgical History:  Procedure Laterality Date  . Kidney Stones    . PROSTATE SURGERY      FAMILY HISTORY: Family History  Problem Relation Age of Onset  . Heart attack Mother   . Heart attack Father   . Colon cancer Neg Hx   . Esophageal cancer Neg Hx   . Liver cancer Neg Hx   . Pancreatic cancer Neg Hx   . Rectal cancer Neg Hx   . Stomach cancer Neg Hx     SOCIAL HISTORY: Social History   Socioeconomic History  . Marital status: Married    Spouse name: Not on file  . Number of children: Not on file  . Years of education: Not on file  . Highest education level: Master's degree (e.g., MA, MS, MEng, MEd, MSW, MBA)  Occupational History    Comment: part time  Tobacco Use  . Smoking status: Never Smoker  . Smokeless tobacco: Never Used  Vaping Use  . Vaping Use: Never used  Substance and Sexual Activity  . Alcohol use: No  . Drug use: No  . Sexual activity: Yes  Other Topics Concern  . Not on file  Social History Narrative   Lives with wife   Caffeine- one coffee/tea daily   Social Determinants of Health   Financial Resource Strain: Not on file  Food Insecurity: Not on file  Transportation Needs: Not on file  Physical Activity: Not on file  Stress: Not on file  Social Connections: Not on file  Intimate Partner Violence: Not on file     PHYSICAL EXAM  GENERAL EXAM/CONSTITUTIONAL: Vitals:  Vitals:   09/28/20 1617  BP: 122/80  Pulse: 92  Weight: 169 lb (76.7 kg)  Height: _0  (1.727 m)     Body mass index is 25.7 kg/m. Wt  Readings from Last 3 Encounters:  09/28/20 169 lb (76.7 kg)  08/14/20 169 lb (76.7 kg)  07/29/20 170 lb (77.1 kg)     Patient is in no distress; well developed, nourished and groomed; neck is supple  CARDIOVASCULAR:  Examination of carotid arteries is normal; no carotid bruits  Regular rate and rhythm, no murmurs  Examination of peripheral vascular system by observation and palpation is normal  EYES:  Ophthalmoscopic exam of optic discs and posterior  segments is normal; no papilledema or hemorrhages  No exam data present  MUSCULOSKELETAL:  Gait, strength, tone, movements noted in Neurologic exam below  NEUROLOGIC: MENTAL STATUS:  No flowsheet data found.  awake, alert, oriented to person, place and time  recent and remote memory intact  normal attention and concentration  language fluent, comprehension intact, naming intact  fund of knowledge appropriate  CRANIAL NERVE:   2nd - no papilledema on fundoscopic exam  2nd, 3rd, 4th, 6th - pupils equal and reactive to light, visual fields full to confrontation, extraocular muscles intact, no nystagmus  5th - facial sensation symmetric  7th - facial strength symmetric  8th - hearing intact  9th - palate elevates symmetrically, uvula midline  11th - shoulder shrug symmetric  12th - tongue protrusion midline  MOTOR:   normal bulk and tone, full strength in the BUE, BLE  SENSORY:   normal and symmetric to light touch, temperature, vibration  COORDINATION:   finger-nose-finger, fine finger movements normal  REFLEXES:   deep tendon reflexes TRACE and symmetric  GAIT/STATION:   narrow based gait      DIAGNOSTIC DATA (LABS, IMAGING, TESTING) - I reviewed patient records, labs, notes, testing and imaging myself where available.  Lab Results  Component Value Date   WBC 7.4 08/14/2020   HGB 14.6 08/14/2020   HCT 43.5 08/14/2020   MCV 83 08/14/2020   PLT 206 08/14/2020      Component Value  Date/Time   NA 137 08/14/2020 1611   K 5.4 (H) 08/14/2020 1611   CL 102 08/14/2020 1611   CO2 19 (L) 08/14/2020 1611   GLUCOSE 205 (H) 08/14/2020 1611   GLUCOSE 244 (H) 08/25/2019 0030   BUN 19 08/14/2020 1611   CREATININE 1.04 08/14/2020 1611   CALCIUM 9.1 08/14/2020 1611   PROT 7.3 08/14/2020 1611   ALBUMIN 4.6 08/14/2020 1611   AST 19 08/14/2020 1611   ALT 10 08/14/2020 1611   ALKPHOS 57 08/14/2020 1611   BILITOT 0.3 08/14/2020 1611   GFRNONAA >60 08/25/2019 0011   GFRAA >60 08/25/2019 0011   Lab Results  Component Value Date   CHOL 130 08/14/2020   HDL 49 08/14/2020   LDLCALC 52 08/14/2020   TRIG 172 (H) 08/14/2020   CHOLHDL 2.7 08/14/2020   Lab Results  Component Value Date   HGBA1C 7.2 (H) 08/14/2020   Lab Results  Component Value Date   [id number] 633 07/29/2020   Lab Results  Component Value Date   TSH 0.565 08/14/2020       ASSESSMENT AND PLAN  65 y.o. year old male here with episode in 2015 with vision disturbance and aphasia diagnosed with TIA.  Then episode of generalized weakness and dizziness in setting of hypertensive events.  Now with intermittent left lateral thigh numbness (usually with prolonged standing > 20 minutes)   Dx:  1. Meralgia paresthetica of left side      PLAN:  LEFT LEG NUMBNESS (intermittent; likely mild meralgia paresthetica) - worse with prolonged standing (trying to avoid static posture) - encouraged to continue walking, stretching, exercises  Return for return to PCP, pending if symptoms worsen or fail to improve.    Penni Bombard, MD 3/54/5625, 6:38 PM Certified in Neurology, Neurophysiology and Neuroimaging  [facility] [address] [place], [place] [address] [phone]

## 2020-10-22 ENCOUNTER — Telehealth: Payer: Self-pay | Admitting: Family Medicine

## 2020-10-22 NOTE — Telephone Encounter (Signed)
Patient is needing his Linzess sent to [facility] on [address] (phone# [phone]). He's out of town and unable to get refill that's at [facility]. Please call him if you have any questions.

## 2020-10-23 ENCOUNTER — Other Ambulatory Visit: Payer: Self-pay

## 2020-10-23 DIAGNOSIS — K5900 Constipation, unspecified: Secondary | ICD-10-CM

## 2020-10-23 MED ORDER — LINACLOTIDE 72 MCG PO CAPS: 72.0000 ug | ORAL_CAPSULE | Freq: Every day | ORAL | 0 refills | Status: AC

## 2020-10-23 NOTE — Telephone Encounter (Signed)
Rx sent today.

## 2020-10-26 DIAGNOSIS — R300 Dysuria: Secondary | ICD-10-CM | POA: Diagnosis not present

## 2020-11-25 DIAGNOSIS — E782 Mixed hyperlipidemia: Secondary | ICD-10-CM | POA: Diagnosis not present

## 2020-11-25 DIAGNOSIS — N529 Male erectile dysfunction, unspecified: Secondary | ICD-10-CM | POA: Diagnosis not present

## 2020-11-25 DIAGNOSIS — R350 Frequency of micturition: Secondary | ICD-10-CM | POA: Diagnosis not present

## 2020-11-25 DIAGNOSIS — K5901 Slow transit constipation: Secondary | ICD-10-CM | POA: Diagnosis not present

## 2020-11-25 DIAGNOSIS — G8929 Other chronic pain: Secondary | ICD-10-CM | POA: Diagnosis not present

## 2020-11-25 DIAGNOSIS — I10 Essential (primary) hypertension: Secondary | ICD-10-CM | POA: Diagnosis not present

## 2020-11-25 DIAGNOSIS — E119 Type 2 diabetes mellitus without complications: Secondary | ICD-10-CM | POA: Diagnosis not present

## 2020-11-25 DIAGNOSIS — N401 Enlarged prostate with lower urinary tract symptoms: Secondary | ICD-10-CM | POA: Diagnosis not present

## 2020-11-25 DIAGNOSIS — M25562 Pain in left knee: Secondary | ICD-10-CM | POA: Diagnosis not present

## 2020-11-25 DIAGNOSIS — Z7689 Persons encountering health services in other specified circumstances: Secondary | ICD-10-CM | POA: Diagnosis not present

## 2020-12-02 DIAGNOSIS — M25562 Pain in left knee: Secondary | ICD-10-CM | POA: Diagnosis not present

## 2020-12-07 DIAGNOSIS — R300 Dysuria: Secondary | ICD-10-CM | POA: Diagnosis not present

## 2021-02-22 ENCOUNTER — Telehealth: Payer: Self-pay | Admitting: Family Medicine

## 2021-02-22 NOTE — Chronic Care Management (AMB) (Signed)
  Chronic Care Management   Outreach Note  02/22/2021 Name: Jordan Bryant MRN: [id number] DOB: Jan 30, 1956  Referred by: Overton Mam, DO Reason for referral : No chief complaint on file.   An unsuccessful telephone outreach was attempted today. The patient was referred to the pharmacist for assistance with care management and care coordination.   Follow Up Plan:   Tatjana Dellinger Upstream Scheduler

## 2021-03-01 ENCOUNTER — Telehealth: Payer: Self-pay | Admitting: Family Medicine

## 2021-03-01 NOTE — Progress Notes (Signed)
  Chronic Care Management   Outreach Note  03/01/2021 Name: Jordan Bryant MRN: [id number] DOB: 1955-07-19  Referred by: Overton Mam, DO Reason for referral : No chief complaint on file.   A second unsuccessful telephone outreach was attempted today. The patient was referred to pharmacist for assistance with care management and care coordination.  Follow Up Plan:   Tatjana Dellinger Upstream Scheduler

## 2021-03-08 ENCOUNTER — Telehealth: Payer: Self-pay | Admitting: Family Medicine

## 2021-03-08 NOTE — Progress Notes (Signed)
  Chronic Care Management   Outreach Note  03/08/2021 Name: Jordan Bryant MRN: [id number] DOB: [birth year]  Referred by: Overton Mam, DO Reason for referral : No chief complaint on file.   Third unsuccessful telephone outreach was attempted today. The patient was referred to the pharmacist for assistance with care management and care coordination.   Follow Up Plan:   Tatjana Dellinger Upstream Scheduler

## 2021-04-02 DIAGNOSIS — R300 Dysuria: Secondary | ICD-10-CM | POA: Diagnosis not present

## 2021-04-06 DIAGNOSIS — R300 Dysuria: Secondary | ICD-10-CM | POA: Diagnosis not present

## 2021-04-07 DIAGNOSIS — E119 Type 2 diabetes mellitus without complications: Secondary | ICD-10-CM | POA: Diagnosis not present

## 2021-04-07 DIAGNOSIS — I10 Essential (primary) hypertension: Secondary | ICD-10-CM | POA: Diagnosis not present

## 2021-04-07 DIAGNOSIS — Z1159 Encounter for screening for other viral diseases: Secondary | ICD-10-CM | POA: Diagnosis not present

## 2021-04-07 DIAGNOSIS — N401 Enlarged prostate with lower urinary tract symptoms: Secondary | ICD-10-CM | POA: Diagnosis not present

## 2021-04-07 DIAGNOSIS — Z125 Encounter for screening for malignant neoplasm of prostate: Secondary | ICD-10-CM | POA: Diagnosis not present

## 2021-04-07 DIAGNOSIS — E559 Vitamin D deficiency, unspecified: Secondary | ICD-10-CM | POA: Diagnosis not present

## 2021-04-07 DIAGNOSIS — R350 Frequency of micturition: Secondary | ICD-10-CM | POA: Diagnosis not present

## 2021-04-07 DIAGNOSIS — E782 Mixed hyperlipidemia: Secondary | ICD-10-CM | POA: Diagnosis not present

## 2021-04-13 DIAGNOSIS — H9193 Unspecified hearing loss, bilateral: Secondary | ICD-10-CM | POA: Diagnosis not present

## 2021-04-13 DIAGNOSIS — E119 Type 2 diabetes mellitus without complications: Secondary | ICD-10-CM | POA: Diagnosis not present

## 2021-04-13 DIAGNOSIS — Z23 Encounter for immunization: Secondary | ICD-10-CM | POA: Diagnosis not present

## 2021-04-13 DIAGNOSIS — I10 Essential (primary) hypertension: Secondary | ICD-10-CM | POA: Diagnosis not present

## 2021-04-13 DIAGNOSIS — N401 Enlarged prostate with lower urinary tract symptoms: Secondary | ICD-10-CM | POA: Diagnosis not present

## 2021-04-13 DIAGNOSIS — E782 Mixed hyperlipidemia: Secondary | ICD-10-CM | POA: Diagnosis not present

## 2021-04-13 DIAGNOSIS — Z7689 Persons encountering health services in other specified circumstances: Secondary | ICD-10-CM | POA: Diagnosis not present

## 2021-04-13 DIAGNOSIS — R350 Frequency of micturition: Secondary | ICD-10-CM | POA: Diagnosis not present

## 2021-04-13 DIAGNOSIS — K5901 Slow transit constipation: Secondary | ICD-10-CM | POA: Diagnosis not present

## 2021-04-26 DIAGNOSIS — Z79899 Other long term (current) drug therapy: Secondary | ICD-10-CM | POA: Diagnosis not present

## 2021-04-26 DIAGNOSIS — E119 Type 2 diabetes mellitus without complications: Secondary | ICD-10-CM | POA: Diagnosis not present

## 2021-04-26 DIAGNOSIS — G4700 Insomnia, unspecified: Secondary | ICD-10-CM | POA: Diagnosis not present

## 2021-04-26 DIAGNOSIS — R0602 Shortness of breath: Secondary | ICD-10-CM | POA: Diagnosis not present

## 2021-04-26 DIAGNOSIS — I10 Essential (primary) hypertension: Secondary | ICD-10-CM | POA: Diagnosis not present

## 2021-04-26 DIAGNOSIS — R002 Palpitations: Secondary | ICD-10-CM | POA: Diagnosis not present

## 2021-04-28 DIAGNOSIS — R0609 Other forms of dyspnea: Secondary | ICD-10-CM | POA: Diagnosis not present

## 2021-04-28 DIAGNOSIS — R9431 Abnormal electrocardiogram [ECG] [EKG]: Secondary | ICD-10-CM | POA: Diagnosis not present

## 2021-04-28 DIAGNOSIS — I2510 Atherosclerotic heart disease of native coronary artery without angina pectoris: Secondary | ICD-10-CM | POA: Diagnosis not present

## 2021-04-28 DIAGNOSIS — R000 Tachycardia, unspecified: Secondary | ICD-10-CM | POA: Diagnosis not present

## 2021-04-28 DIAGNOSIS — I429 Cardiomyopathy, unspecified: Secondary | ICD-10-CM | POA: Diagnosis not present

## 2021-04-28 DIAGNOSIS — I10 Essential (primary) hypertension: Secondary | ICD-10-CM | POA: Diagnosis not present

## 2021-04-28 DIAGNOSIS — R002 Palpitations: Secondary | ICD-10-CM | POA: Diagnosis not present

## 2021-04-28 DIAGNOSIS — I498 Other specified cardiac arrhythmias: Secondary | ICD-10-CM | POA: Diagnosis not present

## 2021-04-28 DIAGNOSIS — R012 Other cardiac sounds: Secondary | ICD-10-CM | POA: Diagnosis not present

## 2021-04-28 DIAGNOSIS — R011 Cardiac murmur, unspecified: Secondary | ICD-10-CM | POA: Diagnosis not present

## 2021-04-28 DIAGNOSIS — R5383 Other fatigue: Secondary | ICD-10-CM | POA: Diagnosis not present

## 2021-05-04 DIAGNOSIS — R399 Unspecified symptoms and signs involving the genitourinary system: Secondary | ICD-10-CM | POA: Diagnosis not present

## 2021-05-04 DIAGNOSIS — N400 Enlarged prostate without lower urinary tract symptoms: Secondary | ICD-10-CM | POA: Diagnosis not present

## 2021-05-04 DIAGNOSIS — N200 Calculus of kidney: Secondary | ICD-10-CM | POA: Diagnosis not present

## 2021-05-14 DIAGNOSIS — R012 Other cardiac sounds: Secondary | ICD-10-CM | POA: Diagnosis not present

## 2021-05-14 DIAGNOSIS — R002 Palpitations: Secondary | ICD-10-CM | POA: Diagnosis not present

## 2021-05-14 DIAGNOSIS — I498 Other specified cardiac arrhythmias: Secondary | ICD-10-CM | POA: Diagnosis not present

## 2021-05-14 DIAGNOSIS — R011 Cardiac murmur, unspecified: Secondary | ICD-10-CM | POA: Diagnosis not present

## 2021-05-14 DIAGNOSIS — I429 Cardiomyopathy, unspecified: Secondary | ICD-10-CM | POA: Diagnosis not present

## 2021-05-14 DIAGNOSIS — R0609 Other forms of dyspnea: Secondary | ICD-10-CM | POA: Diagnosis not present

## 2021-05-14 DIAGNOSIS — I2510 Atherosclerotic heart disease of native coronary artery without angina pectoris: Secondary | ICD-10-CM | POA: Diagnosis not present

## 2021-05-14 DIAGNOSIS — I10 Essential (primary) hypertension: Secondary | ICD-10-CM | POA: Diagnosis not present

## 2021-05-14 DIAGNOSIS — R9431 Abnormal electrocardiogram [ECG] [EKG]: Secondary | ICD-10-CM | POA: Diagnosis not present

## 2021-05-14 DIAGNOSIS — R5383 Other fatigue: Secondary | ICD-10-CM | POA: Diagnosis not present

## 2021-05-19 ENCOUNTER — Telehealth: Payer: Self-pay | Admitting: Family Medicine

## 2021-05-19 DIAGNOSIS — R002 Palpitations: Secondary | ICD-10-CM | POA: Diagnosis not present

## 2021-05-19 NOTE — Telephone Encounter (Signed)
Left message for patient to call back to schedule Medicare Annual Wellness Visit   No hx of AWV eligible as of 05/13/21  Please schedule at anytime with [facility] if patient calls the office back.    45 Minutes appointment   Any questions, please call me at [phone]  

## 2021-05-24 DIAGNOSIS — R000 Tachycardia, unspecified: Secondary | ICD-10-CM | POA: Diagnosis not present

## 2021-06-29 DIAGNOSIS — R399 Unspecified symptoms and signs involving the genitourinary system: Secondary | ICD-10-CM | POA: Diagnosis not present

## 2021-06-29 DIAGNOSIS — N281 Cyst of kidney, acquired: Secondary | ICD-10-CM | POA: Diagnosis not present

## 2021-06-29 DIAGNOSIS — N200 Calculus of kidney: Secondary | ICD-10-CM | POA: Diagnosis not present

## 2021-06-29 DIAGNOSIS — N400 Enlarged prostate without lower urinary tract symptoms: Secondary | ICD-10-CM | POA: Diagnosis not present

## 2021-06-29 DIAGNOSIS — N132 Hydronephrosis with renal and ureteral calculous obstruction: Secondary | ICD-10-CM | POA: Diagnosis not present

## 2021-06-29 DIAGNOSIS — R9341 Abnormal radiologic findings on diagnostic imaging of renal pelvis, ureter, or bladder: Secondary | ICD-10-CM | POA: Diagnosis not present

## 2021-07-01 DIAGNOSIS — M7711 Lateral epicondylitis, right elbow: Secondary | ICD-10-CM | POA: Diagnosis not present

## 2021-07-01 DIAGNOSIS — M25521 Pain in right elbow: Secondary | ICD-10-CM | POA: Diagnosis not present

## 2021-07-05 DIAGNOSIS — R9431 Abnormal electrocardiogram [ECG] [EKG]: Secondary | ICD-10-CM | POA: Diagnosis not present

## 2021-07-05 DIAGNOSIS — E119 Type 2 diabetes mellitus without complications: Secondary | ICD-10-CM | POA: Diagnosis not present

## 2021-07-05 DIAGNOSIS — R5383 Other fatigue: Secondary | ICD-10-CM | POA: Diagnosis not present

## 2021-07-05 DIAGNOSIS — E785 Hyperlipidemia, unspecified: Secondary | ICD-10-CM | POA: Diagnosis not present

## 2021-07-05 DIAGNOSIS — R002 Palpitations: Secondary | ICD-10-CM | POA: Diagnosis not present

## 2021-07-05 DIAGNOSIS — I10 Essential (primary) hypertension: Secondary | ICD-10-CM | POA: Diagnosis not present

## 2021-07-05 DIAGNOSIS — R0609 Other forms of dyspnea: Secondary | ICD-10-CM | POA: Diagnosis not present

## 2021-07-08 DIAGNOSIS — R1013 Epigastric pain: Secondary | ICD-10-CM | POA: Diagnosis not present

## 2021-07-08 DIAGNOSIS — E119 Type 2 diabetes mellitus without complications: Secondary | ICD-10-CM | POA: Diagnosis not present

## 2021-07-08 DIAGNOSIS — K2100 Gastro-esophageal reflux disease with esophagitis, without bleeding: Secondary | ICD-10-CM | POA: Diagnosis not present

## 2021-07-08 DIAGNOSIS — Z7689 Persons encountering health services in other specified circumstances: Secondary | ICD-10-CM | POA: Diagnosis not present

## 2021-07-08 DIAGNOSIS — I10 Essential (primary) hypertension: Secondary | ICD-10-CM | POA: Diagnosis not present

## 2021-07-08 DIAGNOSIS — E782 Mixed hyperlipidemia: Secondary | ICD-10-CM | POA: Diagnosis not present

## 2021-07-09 DIAGNOSIS — N281 Cyst of kidney, acquired: Secondary | ICD-10-CM | POA: Diagnosis not present

## 2021-07-09 DIAGNOSIS — K429 Umbilical hernia without obstruction or gangrene: Secondary | ICD-10-CM | POA: Diagnosis not present

## 2021-07-09 DIAGNOSIS — R1013 Epigastric pain: Secondary | ICD-10-CM | POA: Diagnosis not present

## 2021-07-15 DIAGNOSIS — M25521 Pain in right elbow: Secondary | ICD-10-CM | POA: Diagnosis not present

## 2021-07-15 DIAGNOSIS — M25511 Pain in right shoulder: Secondary | ICD-10-CM | POA: Diagnosis not present

## 2021-07-15 DIAGNOSIS — M79631 Pain in right forearm: Secondary | ICD-10-CM | POA: Diagnosis not present

## 2021-07-15 DIAGNOSIS — M79601 Pain in right arm: Secondary | ICD-10-CM | POA: Diagnosis not present

## 2021-07-19 DIAGNOSIS — R1013 Epigastric pain: Secondary | ICD-10-CM | POA: Diagnosis not present

## 2021-07-19 DIAGNOSIS — K5901 Slow transit constipation: Secondary | ICD-10-CM | POA: Diagnosis not present

## 2021-07-19 DIAGNOSIS — K2100 Gastro-esophageal reflux disease with esophagitis, without bleeding: Secondary | ICD-10-CM | POA: Diagnosis not present

## 2021-07-21 DIAGNOSIS — M25521 Pain in right elbow: Secondary | ICD-10-CM | POA: Diagnosis not present

## 2021-07-26 DIAGNOSIS — M25521 Pain in right elbow: Secondary | ICD-10-CM | POA: Diagnosis not present

## 2021-07-29 DIAGNOSIS — R072 Precordial pain: Secondary | ICD-10-CM | POA: Diagnosis not present

## 2021-07-29 DIAGNOSIS — H903 Sensorineural hearing loss, bilateral: Secondary | ICD-10-CM | POA: Diagnosis not present

## 2021-07-29 DIAGNOSIS — R0609 Other forms of dyspnea: Secondary | ICD-10-CM | POA: Diagnosis not present

## 2021-07-29 DIAGNOSIS — E119 Type 2 diabetes mellitus without complications: Secondary | ICD-10-CM | POA: Diagnosis not present

## 2021-07-29 DIAGNOSIS — M25521 Pain in right elbow: Secondary | ICD-10-CM | POA: Diagnosis not present

## 2021-07-29 DIAGNOSIS — E785 Hyperlipidemia, unspecified: Secondary | ICD-10-CM | POA: Diagnosis not present

## 2021-07-29 DIAGNOSIS — R5383 Other fatigue: Secondary | ICD-10-CM | POA: Diagnosis not present

## 2021-08-02 DIAGNOSIS — M25521 Pain in right elbow: Secondary | ICD-10-CM | POA: Diagnosis not present

## 2021-08-03 DIAGNOSIS — N200 Calculus of kidney: Secondary | ICD-10-CM | POA: Diagnosis not present

## 2021-08-03 DIAGNOSIS — R399 Unspecified symptoms and signs involving the genitourinary system: Secondary | ICD-10-CM | POA: Diagnosis not present

## 2021-08-03 DIAGNOSIS — N281 Cyst of kidney, acquired: Secondary | ICD-10-CM | POA: Diagnosis not present

## 2021-08-03 DIAGNOSIS — N3289 Other specified disorders of bladder: Secondary | ICD-10-CM | POA: Diagnosis not present

## 2021-08-03 DIAGNOSIS — N400 Enlarged prostate without lower urinary tract symptoms: Secondary | ICD-10-CM | POA: Diagnosis not present

## 2021-08-05 DIAGNOSIS — M7711 Lateral epicondylitis, right elbow: Secondary | ICD-10-CM | POA: Diagnosis not present

## 2021-08-05 DIAGNOSIS — M79601 Pain in right arm: Secondary | ICD-10-CM | POA: Diagnosis not present

## 2021-08-05 DIAGNOSIS — M25521 Pain in right elbow: Secondary | ICD-10-CM | POA: Diagnosis not present

## 2021-08-05 DIAGNOSIS — M25511 Pain in right shoulder: Secondary | ICD-10-CM | POA: Diagnosis not present

## 2021-08-09 DIAGNOSIS — M25521 Pain in right elbow: Secondary | ICD-10-CM | POA: Diagnosis not present

## 2021-08-10 DIAGNOSIS — R079 Chest pain, unspecified: Secondary | ICD-10-CM | POA: Diagnosis not present

## 2021-08-10 DIAGNOSIS — E119 Type 2 diabetes mellitus without complications: Secondary | ICD-10-CM | POA: Diagnosis not present

## 2021-08-10 DIAGNOSIS — R0609 Other forms of dyspnea: Secondary | ICD-10-CM | POA: Diagnosis not present

## 2021-08-10 DIAGNOSIS — R5383 Other fatigue: Secondary | ICD-10-CM | POA: Diagnosis not present

## 2021-08-10 DIAGNOSIS — E785 Hyperlipidemia, unspecified: Secondary | ICD-10-CM | POA: Diagnosis not present

## 2021-08-12 DIAGNOSIS — M25521 Pain in right elbow: Secondary | ICD-10-CM | POA: Diagnosis not present

## 2021-08-17 DIAGNOSIS — R350 Frequency of micturition: Secondary | ICD-10-CM | POA: Diagnosis not present

## 2021-08-17 DIAGNOSIS — K5901 Slow transit constipation: Secondary | ICD-10-CM | POA: Diagnosis not present

## 2021-08-17 DIAGNOSIS — E119 Type 2 diabetes mellitus without complications: Secondary | ICD-10-CM | POA: Diagnosis not present

## 2021-08-17 DIAGNOSIS — E782 Mixed hyperlipidemia: Secondary | ICD-10-CM | POA: Diagnosis not present

## 2021-08-17 DIAGNOSIS — N401 Enlarged prostate with lower urinary tract symptoms: Secondary | ICD-10-CM | POA: Diagnosis not present

## 2021-08-17 DIAGNOSIS — I10 Essential (primary) hypertension: Secondary | ICD-10-CM | POA: Diagnosis not present

## 2021-08-17 DIAGNOSIS — Z7689 Persons encountering health services in other specified circumstances: Secondary | ICD-10-CM | POA: Diagnosis not present

## 2021-08-24 DIAGNOSIS — M25521 Pain in right elbow: Secondary | ICD-10-CM | POA: Diagnosis not present

## 2021-09-01 DIAGNOSIS — M7711 Lateral epicondylitis, right elbow: Secondary | ICD-10-CM | POA: Diagnosis not present

## 2021-09-02 DIAGNOSIS — R072 Precordial pain: Secondary | ICD-10-CM | POA: Diagnosis not present

## 2021-09-02 DIAGNOSIS — E119 Type 2 diabetes mellitus without complications: Secondary | ICD-10-CM | POA: Diagnosis not present

## 2021-09-02 DIAGNOSIS — K2100 Gastro-esophageal reflux disease with esophagitis, without bleeding: Secondary | ICD-10-CM | POA: Diagnosis not present

## 2021-09-02 DIAGNOSIS — R000 Tachycardia, unspecified: Secondary | ICD-10-CM | POA: Diagnosis not present

## 2021-09-03 DIAGNOSIS — M25521 Pain in right elbow: Secondary | ICD-10-CM | POA: Diagnosis not present

## 2021-09-09 DIAGNOSIS — K2100 Gastro-esophageal reflux disease with esophagitis, without bleeding: Secondary | ICD-10-CM | POA: Diagnosis not present

## 2021-09-09 DIAGNOSIS — K449 Diaphragmatic hernia without obstruction or gangrene: Secondary | ICD-10-CM | POA: Diagnosis not present

## 2021-09-09 DIAGNOSIS — Z7982 Long term (current) use of aspirin: Secondary | ICD-10-CM | POA: Diagnosis not present

## 2021-09-09 DIAGNOSIS — K209 Esophagitis, unspecified without bleeding: Secondary | ICD-10-CM | POA: Diagnosis not present

## 2021-09-09 DIAGNOSIS — I10 Essential (primary) hypertension: Secondary | ICD-10-CM | POA: Diagnosis not present

## 2021-09-09 DIAGNOSIS — R0789 Other chest pain: Secondary | ICD-10-CM | POA: Diagnosis not present

## 2021-09-09 DIAGNOSIS — K319 Disease of stomach and duodenum, unspecified: Secondary | ICD-10-CM | POA: Diagnosis not present

## 2021-09-09 DIAGNOSIS — E785 Hyperlipidemia, unspecified: Secondary | ICD-10-CM | POA: Diagnosis not present

## 2021-09-09 DIAGNOSIS — E119 Type 2 diabetes mellitus without complications: Secondary | ICD-10-CM | POA: Diagnosis not present

## 2021-09-09 DIAGNOSIS — K2210 Ulcer of esophagus without bleeding: Secondary | ICD-10-CM | POA: Diagnosis not present

## 2021-09-09 DIAGNOSIS — M25521 Pain in right elbow: Secondary | ICD-10-CM | POA: Diagnosis not present

## 2021-09-09 DIAGNOSIS — K3189 Other diseases of stomach and duodenum: Secondary | ICD-10-CM | POA: Diagnosis not present

## 2021-09-09 DIAGNOSIS — K2950 Unspecified chronic gastritis without bleeding: Secondary | ICD-10-CM | POA: Diagnosis not present

## 2021-09-13 DIAGNOSIS — M7521 Bicipital tendinitis, right shoulder: Secondary | ICD-10-CM | POA: Diagnosis not present

## 2021-09-14 DIAGNOSIS — M25521 Pain in right elbow: Secondary | ICD-10-CM | POA: Diagnosis not present

## 2021-09-15 DIAGNOSIS — M24821 Other specific joint derangements of right elbow, not elsewhere classified: Secondary | ICD-10-CM | POA: Diagnosis not present

## 2021-09-15 DIAGNOSIS — R600 Localized edema: Secondary | ICD-10-CM | POA: Diagnosis not present

## 2021-09-15 DIAGNOSIS — M7711 Lateral epicondylitis, right elbow: Secondary | ICD-10-CM | POA: Diagnosis not present

## 2021-09-23 DIAGNOSIS — M25521 Pain in right elbow: Secondary | ICD-10-CM | POA: Diagnosis not present

## 2021-10-18 DIAGNOSIS — R0609 Other forms of dyspnea: Secondary | ICD-10-CM | POA: Diagnosis not present

## 2021-10-18 DIAGNOSIS — R5383 Other fatigue: Secondary | ICD-10-CM | POA: Diagnosis not present

## 2021-10-18 DIAGNOSIS — I10 Essential (primary) hypertension: Secondary | ICD-10-CM | POA: Diagnosis not present

## 2021-10-18 DIAGNOSIS — Z9189 Other specified personal risk factors, not elsewhere classified: Secondary | ICD-10-CM | POA: Diagnosis not present

## 2021-10-19 DIAGNOSIS — M25521 Pain in right elbow: Secondary | ICD-10-CM | POA: Diagnosis not present

## 2021-10-26 DIAGNOSIS — M25521 Pain in right elbow: Secondary | ICD-10-CM | POA: Diagnosis not present

## 2021-11-02 DIAGNOSIS — M25521 Pain in right elbow: Secondary | ICD-10-CM | POA: Diagnosis not present

## 2021-11-03 DIAGNOSIS — E119 Type 2 diabetes mellitus without complications: Secondary | ICD-10-CM | POA: Diagnosis not present

## 2021-11-03 DIAGNOSIS — H524 Presbyopia: Secondary | ICD-10-CM | POA: Diagnosis not present

## 2021-11-05 DIAGNOSIS — K219 Gastro-esophageal reflux disease without esophagitis: Secondary | ICD-10-CM | POA: Diagnosis not present

## 2021-11-05 DIAGNOSIS — Z79899 Other long term (current) drug therapy: Secondary | ICD-10-CM | POA: Diagnosis not present

## 2021-11-05 DIAGNOSIS — Z7982 Long term (current) use of aspirin: Secondary | ICD-10-CM | POA: Diagnosis not present

## 2021-11-05 DIAGNOSIS — Z95818 Presence of other cardiac implants and grafts: Secondary | ICD-10-CM | POA: Diagnosis not present

## 2021-11-05 DIAGNOSIS — R0609 Other forms of dyspnea: Secondary | ICD-10-CM | POA: Diagnosis not present

## 2021-11-05 DIAGNOSIS — Z7984 Long term (current) use of oral hypoglycemic drugs: Secondary | ICD-10-CM | POA: Diagnosis not present

## 2021-11-05 DIAGNOSIS — I4510 Unspecified right bundle-branch block: Secondary | ICD-10-CM | POA: Diagnosis not present

## 2021-11-05 DIAGNOSIS — E119 Type 2 diabetes mellitus without complications: Secondary | ICD-10-CM | POA: Diagnosis not present

## 2021-11-05 DIAGNOSIS — I452 Bifascicular block: Secondary | ICD-10-CM | POA: Diagnosis not present

## 2021-11-05 DIAGNOSIS — I444 Left anterior fascicular block: Secondary | ICD-10-CM | POA: Diagnosis not present

## 2021-11-05 DIAGNOSIS — R079 Chest pain, unspecified: Secondary | ICD-10-CM | POA: Diagnosis not present

## 2021-11-05 DIAGNOSIS — R0602 Shortness of breath: Secondary | ICD-10-CM | POA: Diagnosis not present

## 2021-11-05 DIAGNOSIS — I25112 Atherosclerosic heart disease of native coronary artery with refractory angina pectoris: Secondary | ICD-10-CM | POA: Diagnosis not present

## 2021-11-05 DIAGNOSIS — R9431 Abnormal electrocardiogram [ECG] [EKG]: Secondary | ICD-10-CM | POA: Diagnosis not present

## 2021-11-05 DIAGNOSIS — I10 Essential (primary) hypertension: Secondary | ICD-10-CM | POA: Diagnosis not present

## 2021-11-05 DIAGNOSIS — Z8673 Personal history of transient ischemic attack (TIA), and cerebral infarction without residual deficits: Secondary | ICD-10-CM | POA: Diagnosis not present

## 2021-11-05 DIAGNOSIS — I25118 Atherosclerotic heart disease of native coronary artery with other forms of angina pectoris: Secondary | ICD-10-CM | POA: Diagnosis not present

## 2021-11-06 DIAGNOSIS — Z7984 Long term (current) use of oral hypoglycemic drugs: Secondary | ICD-10-CM | POA: Diagnosis not present

## 2021-11-06 DIAGNOSIS — I10 Essential (primary) hypertension: Secondary | ICD-10-CM | POA: Diagnosis not present

## 2021-11-06 DIAGNOSIS — Z7982 Long term (current) use of aspirin: Secondary | ICD-10-CM | POA: Diagnosis not present

## 2021-11-06 DIAGNOSIS — I202 Refractory angina pectoris: Secondary | ICD-10-CM | POA: Diagnosis not present

## 2021-11-06 DIAGNOSIS — K219 Gastro-esophageal reflux disease without esophagitis: Secondary | ICD-10-CM | POA: Diagnosis not present

## 2021-11-06 DIAGNOSIS — R0602 Shortness of breath: Secondary | ICD-10-CM | POA: Diagnosis not present

## 2021-11-06 DIAGNOSIS — Z79899 Other long term (current) drug therapy: Secondary | ICD-10-CM | POA: Diagnosis not present

## 2021-11-06 DIAGNOSIS — Z95818 Presence of other cardiac implants and grafts: Secondary | ICD-10-CM | POA: Diagnosis not present

## 2021-11-06 DIAGNOSIS — I25118 Atherosclerotic heart disease of native coronary artery with other forms of angina pectoris: Secondary | ICD-10-CM | POA: Diagnosis not present

## 2021-11-06 DIAGNOSIS — Z8673 Personal history of transient ischemic attack (TIA), and cerebral infarction without residual deficits: Secondary | ICD-10-CM | POA: Diagnosis not present

## 2021-11-06 DIAGNOSIS — R0609 Other forms of dyspnea: Secondary | ICD-10-CM | POA: Diagnosis not present

## 2021-11-06 DIAGNOSIS — R079 Chest pain, unspecified: Secondary | ICD-10-CM | POA: Diagnosis not present

## 2021-11-06 DIAGNOSIS — E119 Type 2 diabetes mellitus without complications: Secondary | ICD-10-CM | POA: Diagnosis not present

## 2021-11-09 DIAGNOSIS — M25521 Pain in right elbow: Secondary | ICD-10-CM | POA: Diagnosis not present

## 2021-11-11 IMAGING — DX DG LUMBAR SPINE COMPLETE 4+V
5 series · 5 of 5 positions shown · non-contrast
Comparison: None.

CLINICAL DATA: Left leg numbness x2 months

EXAM:
LUMBAR SPINE - COMPLETE 4+ VIEW

[lumbar spine ap]
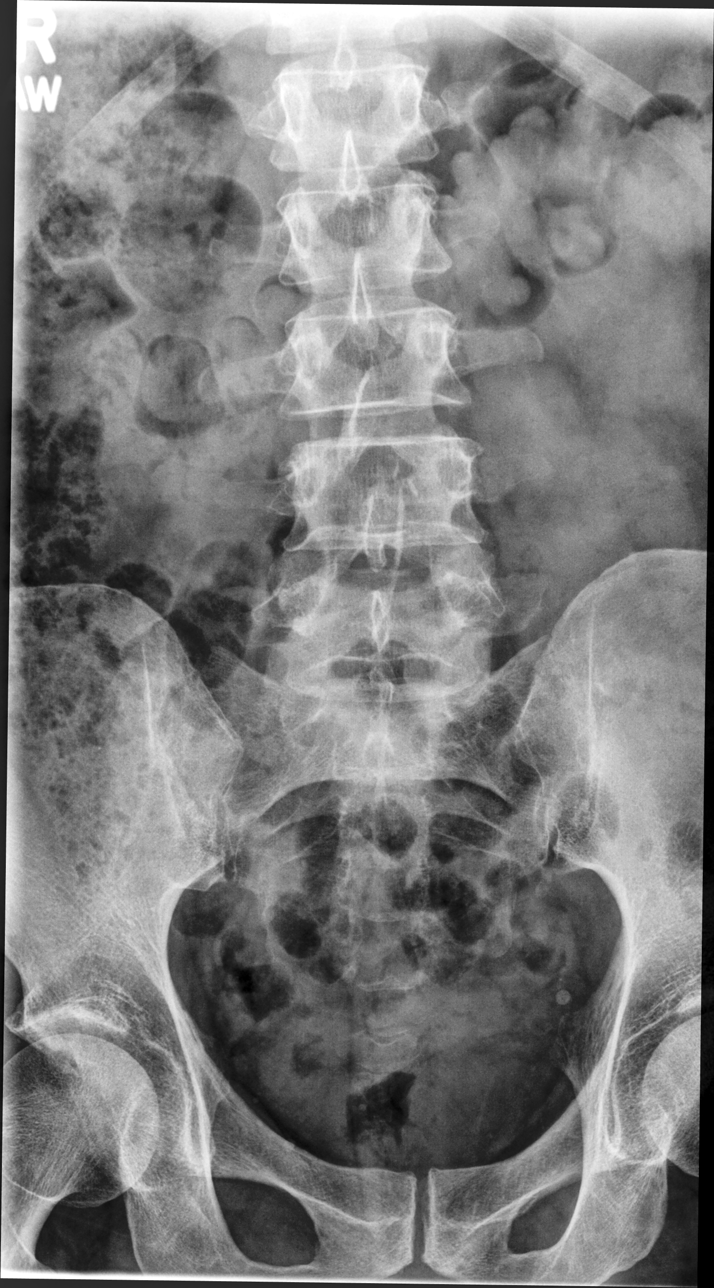

[lumbar spine lmo (1 of 2)]
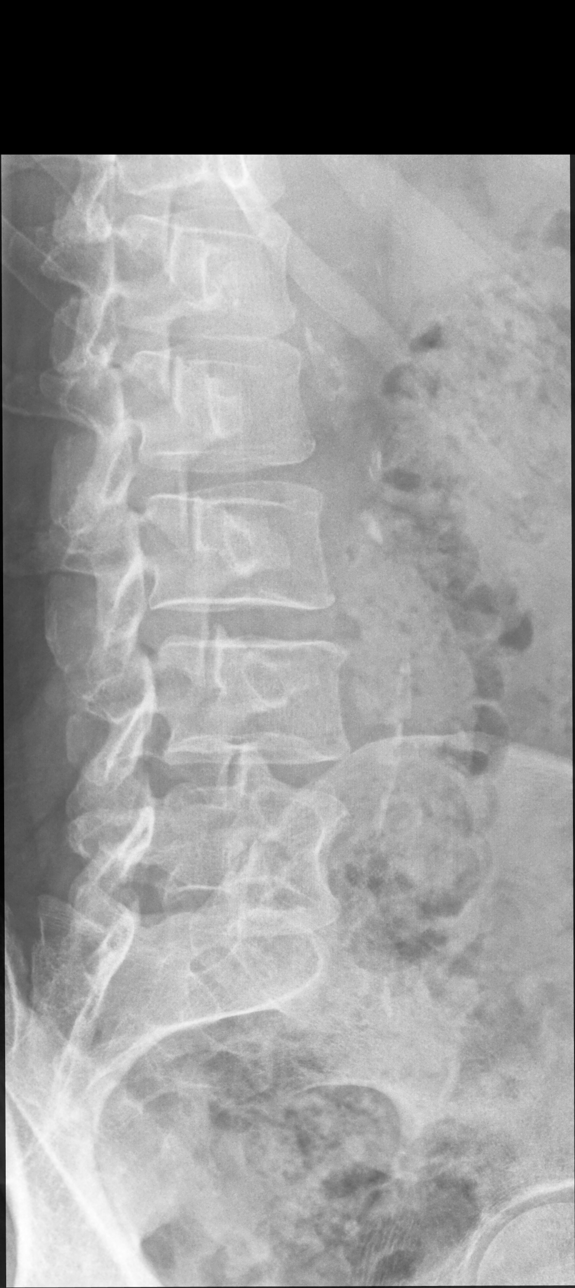

[lumbar spine lmo (2 of 2)]
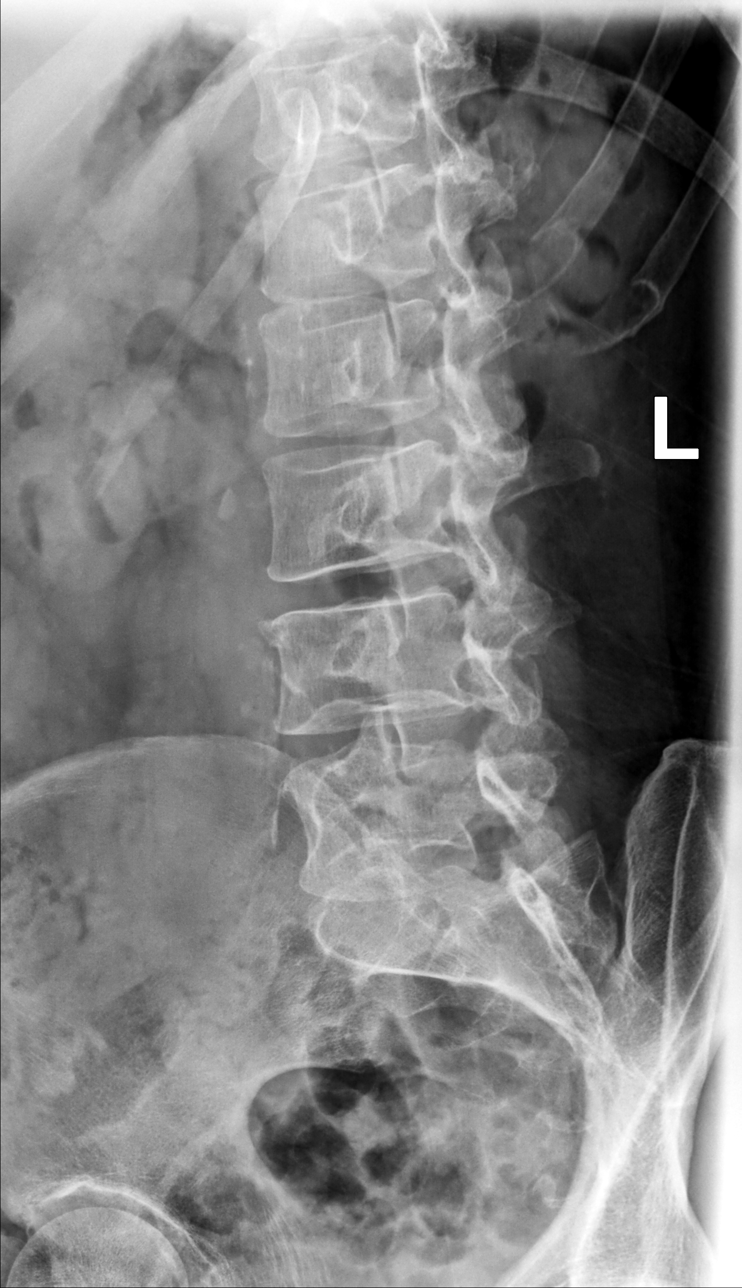

[lumbar spine lat (1 of 2)]
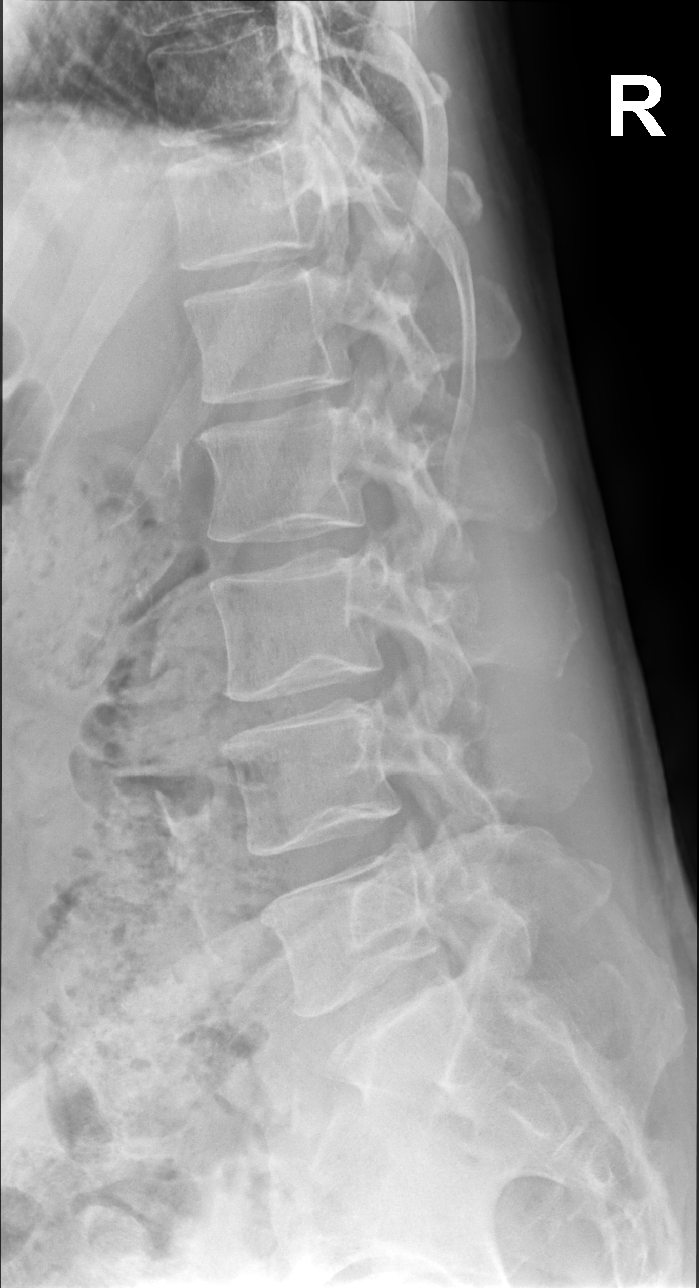

[lumbar spine lat (2 of 2)]
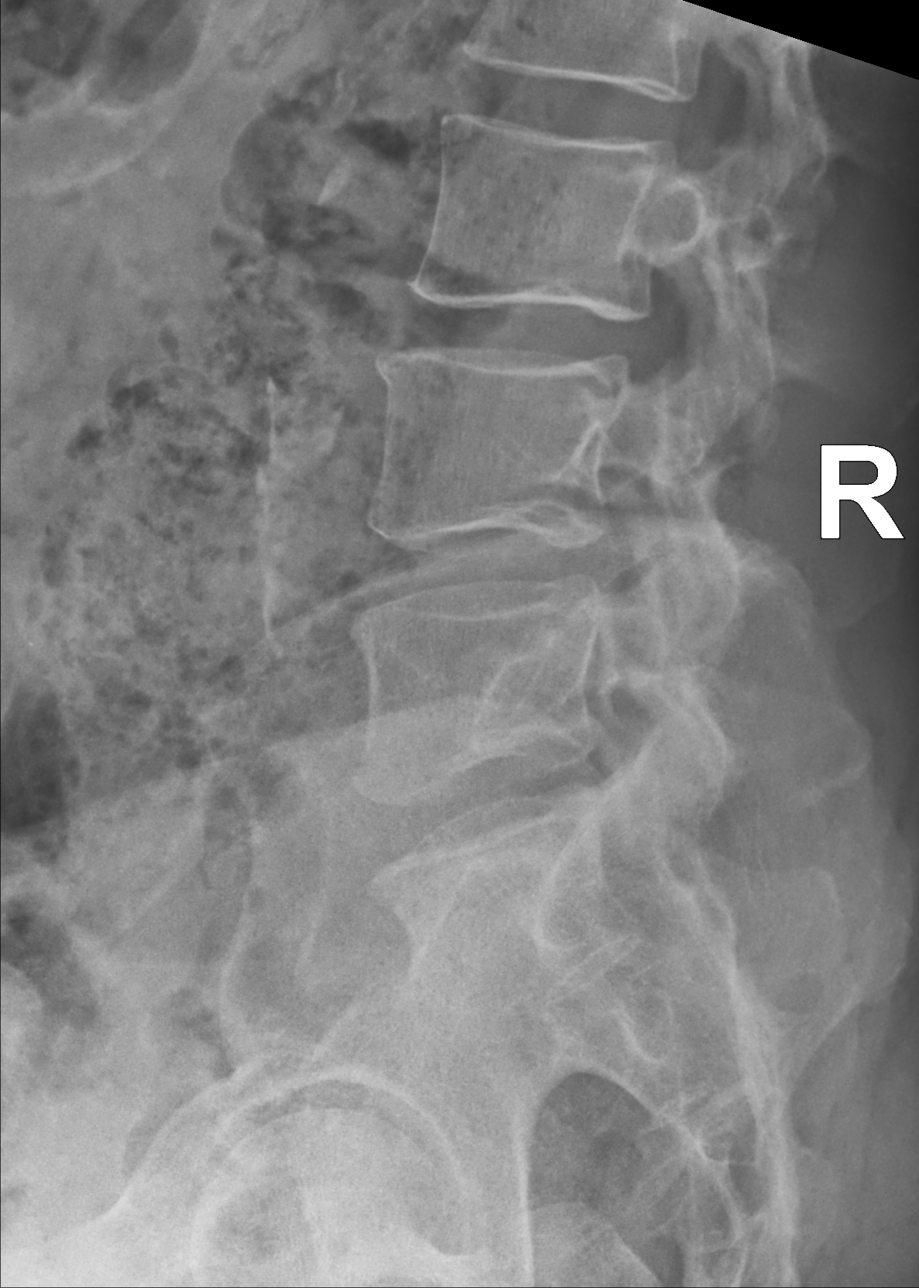

[5 of 5 positions shown; findings below may reference images not displayed]

FINDINGS: Five lumbar-type vertebral bodies.

Normal lumbar lordosis.

No evidence of fracture or dislocation. Vertebral body heights are
maintained. Very mild degenerative changes at L3-4.

Visualized bony pelvis appears intact.

Mild vascular calcifications.
IMPRESSION: Negative lumbar spine radiographs.
